# Patient Record
Sex: Female | Born: 1970
Health system: Southern US, Community
[De-identification: ages and names within clinical notes are randomized; demographics above are authoritative.]

## PROBLEM LIST (undated history)

## (undated) DIAGNOSIS — G43909 Migraine, unspecified, not intractable, without status migrainosus: Secondary | ICD-10-CM

## (undated) DIAGNOSIS — T7840XA Allergy, unspecified, initial encounter: Secondary | ICD-10-CM

## (undated) DIAGNOSIS — N390 Urinary tract infection, site not specified: Secondary | ICD-10-CM

## (undated) DIAGNOSIS — B019 Varicella without complication: Secondary | ICD-10-CM

## (undated) DIAGNOSIS — F32A Depression, unspecified: Secondary | ICD-10-CM

## (undated) HISTORY — DX: Depression, unspecified: F32.A

## (undated) HISTORY — DX: Varicella without complication: B01.9

## (undated) HISTORY — DX: Allergy, unspecified, initial encounter: T78.40XA

## (undated) HISTORY — DX: Urinary tract infection, site not specified: N39.0

## (undated) HISTORY — DX: Migraine, unspecified, not intractable, without status migrainosus: G43.909

## (undated) HISTORY — PX: ABDOMINOPLASTY: SUR9

---

## 2011-12-18 ENCOUNTER — Encounter (HOSPITAL_BASED_OUTPATIENT_CLINIC_OR_DEPARTMENT_OTHER): Payer: Self-pay | Admitting: *Deleted

## 2011-12-18 ENCOUNTER — Emergency Department (HOSPITAL_BASED_OUTPATIENT_CLINIC_OR_DEPARTMENT_OTHER)
Admission: EM | Admit: 2011-12-18 | Discharge: 2011-12-19 | Disposition: A | Discharge status: Home / Self Care | Payer: No Typology Code available for payment source | Attending: Emergency Medicine | Admitting: Emergency Medicine

## 2011-12-18 ENCOUNTER — Emergency Department (HOSPITAL_BASED_OUTPATIENT_CLINIC_OR_DEPARTMENT_OTHER): Payer: No Typology Code available for payment source

## 2011-12-18 DIAGNOSIS — Z043 Encounter for examination and observation following other accident: Secondary | ICD-10-CM | POA: Insufficient documentation

## 2011-12-18 DIAGNOSIS — S39012A Strain of muscle, fascia and tendon of lower back, initial encounter: Secondary | ICD-10-CM

## 2011-12-18 MED ORDER — IBUPROFEN 800 MG PO TABS
800.0000 mg | ORAL_TABLET | Freq: Once | ORAL | Status: AC
  Administered 2011-12-18: 800 mg via ORAL
  Filled 2011-12-18: qty 1

## 2011-12-18 MED ORDER — TRAMADOL HCL 50 MG PO TABS: 50.0000 mg | ORAL_TABLET | Freq: Four times a day (QID) | ORAL | Status: DC | PRN
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
## 2015-06-02 ENCOUNTER — Encounter (HOSPITAL_BASED_OUTPATIENT_CLINIC_OR_DEPARTMENT_OTHER): Payer: Self-pay

## 2015-06-02 ENCOUNTER — Emergency Department (HOSPITAL_BASED_OUTPATIENT_CLINIC_OR_DEPARTMENT_OTHER)
Admission: EM | Admit: 2015-06-02 | Discharge: 2015-06-03 | Disposition: A | Discharge status: Home / Self Care | Payer: No Typology Code available for payment source | Attending: Emergency Medicine | Admitting: Emergency Medicine

## 2015-06-02 DIAGNOSIS — Z8619 Personal history of other infectious and parasitic diseases: Secondary | ICD-10-CM | POA: Insufficient documentation

## 2015-06-02 DIAGNOSIS — M545 Low back pain: Secondary | ICD-10-CM | POA: Insufficient documentation

## 2015-06-02 DIAGNOSIS — G8929 Other chronic pain: Secondary | ICD-10-CM | POA: Insufficient documentation

## 2015-06-02 DIAGNOSIS — N72 Inflammatory disease of cervix uteri: Secondary | ICD-10-CM

## 2015-06-02 DIAGNOSIS — Z3202 Encounter for pregnancy test, result negative: Secondary | ICD-10-CM | POA: Insufficient documentation

## 2015-06-02 LAB — URINALYSIS, ROUTINE W REFLEX MICROSCOPIC
BILIRUBIN URINE: NEGATIVE
Glucose, UA: NEGATIVE mg/dL
HGB URINE DIPSTICK: NEGATIVE
KETONES UR: NEGATIVE mg/dL
Leukocytes, UA: NEGATIVE
NITRITE: NEGATIVE
PH: 5 (ref 5.0–8.0)
Protein, ur: NEGATIVE mg/dL
SPECIFIC GRAVITY, URINE: 1.025 (ref 1.005–1.030)

## 2015-06-02 LAB — WET PREP, GENITAL
SPERM: NONE SEEN
Trich, Wet Prep: NONE SEEN
YEAST WET PREP: NONE SEEN

## 2015-06-02 LAB — PREGNANCY, URINE: Preg Test, Ur: NEGATIVE

## 2015-06-02 MED ORDER — CEFTRIAXONE SODIUM 250 MG IJ SOLR
250.0000 mg | Freq: Once | INTRAMUSCULAR | Status: AC
  Administered 2015-06-03: 250 mg via INTRAMUSCULAR
  Filled 2015-06-02: qty 250

## 2015-06-02 MED ORDER — IBUPROFEN 800 MG PO TABS: 800.0000 mg | ORAL_TABLET | Freq: Three times a day (TID) | ORAL | Status: DC | PRN

## 2015-06-02 MED ORDER — AZITHROMYCIN 250 MG PO TABS
1000.0000 mg | ORAL_TABLET | Freq: Once | ORAL | Status: AC
Start: 2015-06-02 — End: 2015-06-03
  Administered 2015-06-03: 1000 mg via ORAL
  Filled 2015-06-02: qty 4

## 2015-06-02 MED ORDER — LIDOCAINE HCL (PF) 1 % IJ SOLN
INTRAMUSCULAR | Status: AC
  Administered 2015-06-03: 5 mL
  Filled 2015-06-02: qty 5

## 2015-06-02 NOTE — ED Notes (Signed)
Pt states has had back pain x 1 year worse last 2 days,  Pelvic pain x 3-4 days  Denies urinary sx,  Denies vag dc

## 2015-06-02 NOTE — ED Notes (Signed)
Lower back pain and pelvic pain x 3 days-denies n/v/d or vaginal d/c-NAD-steady gait
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
## 2015-06-02 NOTE — Discharge Instructions (Signed)
Cervicitis Cervicitis is a soreness and swelling (inflammation) of the cervix. Your cervix is located at the bottom of your uterus. It opens up to the vagina. CAUSES   Sexually transmitted infections (STIs).   Allergic reaction.   Medicines or birth control devices that are put in the vagina.   Injury to the cervix.   Bacterial infections.  RISK FACTORS You are at greater risk if you:  Have unprotected sexual intercourse.  Have sexual intercourse with many partners.  Began sexual intercourse at an early age.  Have a history of STIs. SYMPTOMS  There may be no symptoms. If symptoms occur, they may include:   Gray, white, yellow, or bad-smelling vaginal discharge.   Pain or itching of the area outside the vagina.   Painful sexual intercourse.   Lower abdominal or lower back pain, especially during intercourse.   Frequent urination.   Abnormal vaginal bleeding between periods, after sexual intercourse, or after menopause.   Pressure or a heavy feeling in the pelvis.  DIAGNOSIS  Diagnosis is made after a pelvic exam. Other tests may include:   Examination of any discharge under a microscope (wet prep).   A Pap test.  TREATMENT  Treatment will depend on the cause of cervicitis. If it is caused by an STI, both you and your partner will need to be treated. Antibiotic medicines will be given.  HOME CARE INSTRUCTIONS   Do not have sexual intercourse until your health care provider says it is okay.   Do not have sexual intercourse until your partner has been treated, if your cervicitis is caused by an STI.   Take your antibiotics as directed. Finish them even if you start to feel better.  SEEK MEDICAL CARE IF:  Your symptoms come back.   You have a fever.  MAKE SURE YOU:   Understand these instructions.  Will watch your condition.  Will get help right away if you are not doing well or get worse.   This information is not intended to replace  advice given to you by your health care provider. Make sure you discuss any questions you have with your health care provider.   Document Released: 03/13/2005 Document Revised: 03/18/2013 Document Reviewed: 09/04/2012 Elsevier Interactive Patient Education [employer].

## 2015-06-03 LAB — RAPID HIV SCREEN (HIV 1/2 AB+AG)
HIV 1/2 ANTIBODIES: NONREACTIVE
HIV-1 P24 ANTIGEN - HIV24: NONREACTIVE

## 2015-06-04 LAB — GC/CHLAMYDIA PROBE AMP (~~LOC~~) NOT AT ARMC
Chlamydia: NEGATIVE
NEISSERIA GONORRHEA: NEGATIVE

## 2015-06-04 LAB — RPR: RPR: NONREACTIVE

## 2015-09-02 ENCOUNTER — Encounter (HOSPITAL_COMMUNITY): Payer: Self-pay | Admitting: Emergency Medicine

## 2015-09-02 ENCOUNTER — Emergency Department (HOSPITAL_COMMUNITY)
Admission: EM | Admit: 2015-09-02 | Discharge: 2015-09-02 | Disposition: A | Discharge status: Home / Self Care | Payer: No Typology Code available for payment source | Attending: Emergency Medicine | Admitting: Emergency Medicine

## 2015-09-02 ENCOUNTER — Emergency Department (HOSPITAL_COMMUNITY): Payer: No Typology Code available for payment source

## 2015-09-02 DIAGNOSIS — M545 Low back pain, unspecified: Secondary | ICD-10-CM

## 2015-09-02 DIAGNOSIS — Y939 Activity, unspecified: Secondary | ICD-10-CM | POA: Diagnosis not present

## 2015-09-02 DIAGNOSIS — Y999 Unspecified external cause status: Secondary | ICD-10-CM | POA: Diagnosis not present

## 2015-09-02 DIAGNOSIS — Y92410 Unspecified street and highway as the place of occurrence of the external cause: Secondary | ICD-10-CM | POA: Insufficient documentation

## 2015-09-02 MED ORDER — OXYCODONE HCL 5 MG PO TABS
5.0000 mg | ORAL_TABLET | Freq: Once | ORAL | Status: AC
  Administered 2015-09-02: 5 mg via ORAL
  Filled 2015-09-02: qty 1

## 2015-09-02 MED ORDER — NAPROXEN 500 MG PO TABS: 500.0000 mg | ORAL_TABLET | Freq: Two times a day (BID) | ORAL | Status: DC

## 2015-09-02 MED ORDER — METHOCARBAMOL 500 MG PO TABS: 500.0000 mg | ORAL_TABLET | Freq: Two times a day (BID) | ORAL | Status: DC
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
## 2015-09-02 NOTE — Discharge Instructions (Signed)
Take your medications as prescribed. I also recommend applying ice to affected areas for 15-20 minutes 3-4 times daily as needed for pain relief. Follow-up with your primary care provider within the next week if your symptoms have not improved. Please return to the Emergency Department if symptoms worsen or new onset of fever, numbness, tingling, groin numbness, abdominal pain, urinary retention, loss of bowel or bladder, weakness.
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
## 2016-01-09 ENCOUNTER — Encounter (HOSPITAL_BASED_OUTPATIENT_CLINIC_OR_DEPARTMENT_OTHER): Payer: Self-pay | Admitting: *Deleted

## 2016-01-09 DIAGNOSIS — M542 Cervicalgia: Secondary | ICD-10-CM | POA: Insufficient documentation

## 2016-01-09 DIAGNOSIS — Y9389 Activity, other specified: Secondary | ICD-10-CM | POA: Diagnosis not present

## 2016-01-09 DIAGNOSIS — Y999 Unspecified external cause status: Secondary | ICD-10-CM | POA: Insufficient documentation

## 2016-01-09 DIAGNOSIS — Y92410 Unspecified street and highway as the place of occurrence of the external cause: Secondary | ICD-10-CM | POA: Diagnosis not present

## 2016-01-09 DIAGNOSIS — M25512 Pain in left shoulder: Secondary | ICD-10-CM | POA: Insufficient documentation
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
## 2016-01-10 ENCOUNTER — Emergency Department (HOSPITAL_BASED_OUTPATIENT_CLINIC_OR_DEPARTMENT_OTHER)
Admission: EM | Admit: 2016-01-10 | Discharge: 2016-01-10 | Disposition: A | Discharge status: Home / Self Care | Payer: Medicaid Other | Attending: Emergency Medicine | Admitting: Emergency Medicine

## 2016-01-10 DIAGNOSIS — M542 Cervicalgia: Secondary | ICD-10-CM

## 2016-01-10 LAB — PREGNANCY, URINE: PREG TEST UR: NEGATIVE

## 2016-01-10 MED ORDER — ACETAMINOPHEN 325 MG PO TABS
650.0000 mg | ORAL_TABLET | Freq: Once | ORAL | Status: AC
  Administered 2016-01-10: 650 mg via ORAL
  Filled 2016-01-10: qty 2

## 2016-01-10 MED ORDER — IBUPROFEN 800 MG PO TABS: 800.0000 mg | ORAL_TABLET | Freq: Three times a day (TID) | ORAL | 0 refills | Status: DC

## 2016-01-10 MED ORDER — IBUPROFEN 800 MG PO TABS
800.0000 mg | ORAL_TABLET | Freq: Once | ORAL | Status: AC
  Administered 2016-01-10: 800 mg via ORAL
  Filled 2016-01-10: qty 1

## 2016-01-10 MED ORDER — CYCLOBENZAPRINE HCL 10 MG PO TABS: 10.0000 mg | ORAL_TABLET | Freq: Two times a day (BID) | ORAL | 0 refills | Status: DC | PRN
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
## 2016-01-10 NOTE — ED Notes (Signed)
Patient is in good condition, discharge instrucitons reviewed, prescription medication reviewed, follow up care reviewed; patient verbalized understanding; patient is ambulatory, going home with son

## 2019-11-19 ENCOUNTER — Other Ambulatory Visit: Payer: Self-pay

## 2019-11-19 ENCOUNTER — Ambulatory Visit
Admission: EM | Admit: 2019-11-19 | Discharge: 2019-11-19 | Disposition: A | Discharge status: Home / Self Care | Payer: 59 | Attending: Physician Assistant | Admitting: Physician Assistant

## 2019-11-19 ENCOUNTER — Ambulatory Visit (INDEPENDENT_AMBULATORY_CARE_PROVIDER_SITE_OTHER): Payer: 59

## 2019-11-19 DIAGNOSIS — R05 Cough: Secondary | ICD-10-CM | POA: Diagnosis not present

## 2019-11-19 DIAGNOSIS — R0902 Hypoxemia: Secondary | ICD-10-CM | POA: Diagnosis not present

## 2019-11-19 DIAGNOSIS — U071 COVID-19: Secondary | ICD-10-CM | POA: Diagnosis not present

## 2019-11-19 DIAGNOSIS — J1282 Pneumonia due to coronavirus disease 2019: Secondary | ICD-10-CM | POA: Diagnosis not present

## 2019-11-19 DIAGNOSIS — R509 Fever, unspecified: Secondary | ICD-10-CM | POA: Diagnosis not present

## 2019-11-19 DIAGNOSIS — J189 Pneumonia, unspecified organism: Secondary | ICD-10-CM | POA: Diagnosis not present

## 2019-11-19 MED ORDER — ACETAMINOPHEN 325 MG PO TABS
650.0000 mg | ORAL_TABLET | Freq: Once | ORAL | Status: AC
  Administered 2019-11-19: 650 mg via ORAL

## 2019-11-19 MED ORDER — IPRATROPIUM-ALBUTEROL 0.5-2.5 (3) MG/3ML IN SOLN
3.0000 mL | Freq: Once | RESPIRATORY_TRACT | Status: AC
  Administered 2019-11-19: 3 mL via RESPIRATORY_TRACT

## 2019-11-19 MED ORDER — ALBUTEROL SULFATE (2.5 MG/3ML) 0.083% IN NEBU: 2.5000 mg | INHALATION_SOLUTION | Freq: Four times a day (QID) | RESPIRATORY_TRACT | 0 refills | Status: DC | PRN

## 2019-11-19 MED ORDER — DOXYCYCLINE HYCLATE 100 MG PO CAPS: 100.0000 mg | ORAL_CAPSULE | Freq: Two times a day (BID) | ORAL | 0 refills | Status: DC

## 2019-11-19 MED ORDER — BENZONATATE 200 MG PO CAPS: 200.0000 mg | ORAL_CAPSULE | Freq: Three times a day (TID) | ORAL | 0 refills | Status: DC

## 2019-11-19 NOTE — ED Provider Notes (Addendum)
[facility]    CSN: [id number] Arrival date & time: 11/19/19  1828      History   Chief Complaint Chief Complaint  Patient presents with  . Fever  . Cough    HPI Faith Roth is a 49 y.o. female.   49 year old female comes in with 1 week history of URI symptoms with positive COVID test. Has had cough, fever, shob, nasal congestion, rhinorrhea. Symptoms worse the past few days. tmax 102, responsive to antipyretic. Denies nausea/vomiting, diarrhea.      History reviewed. No pertinent past medical history.  There are no problems to display for this patient.   Past Surgical History:  Procedure Laterality Date  . CESAREAN SECTION      OB History   No obstetric history on file.      Home Medications    Prior to Admission medications   Medication Sig Start Date End Date Taking? Authorizing Provider  albuterol (PROVENTIL) (2.5 MG/3ML) 0.083% nebulizer solution Take 3 mLs (2.5 mg total) by nebulization every 6 (six) hours as needed for wheezing or shortness of breath. 11/19/19   Tasia Catchings, Italo Banton V, PA-C  benzonatate (TESSALON) 200 MG capsule Take 1 capsule (200 mg total) by mouth every 8 (eight) hours. 11/19/19   Tasia Catchings, Jordayn Mink V, PA-C  cyclobenzaprine (FLEXERIL) 10 MG tablet Take 1 tablet (10 mg total) by mouth 2 (two) times daily as needed for muscle spasms. 01/10/16   Gloriann Loan, PA-C  doxycycline (VIBRAMYCIN) 100 MG capsule Take 1 capsule (100 mg total) by mouth 2 (two) times daily. 11/19/19   Tasia Catchings, Janett Kamath V, PA-C  ibuprofen (ADVIL,MOTRIN) 800 MG tablet Take 1 tablet (800 mg total) by mouth 3 (three) times daily. 01/10/16   Gloriann Loan, PA-C  methocarbamol (ROBAXIN) 500 MG tablet Take 1 tablet (500 mg total) by mouth 2 (two) times daily. 09/02/15   Nona Dell, PA-C  naproxen (NAPROSYN) 500 MG tablet Take 1 tablet (500 mg total) by mouth 2 (two) times daily. 09/02/15   Nona Dell, PA-C  PRESCRIPTION MEDICATION Take 1 tablet by mouth 2 (two) times daily  as needed (headaches/ migraines with mesntraul cycle).    [provider]    Family History Family History  Problem Relation Age of Onset  . Hypertension Mother   . Diabetes Mother   . Hypertension Father   . Diabetes Father     Social History Social History   Tobacco Use  . Smoking status: Never Smoker  Substance Use Topics  . Alcohol use: No  . Drug use: No     Allergies   Propofol   Review of Systems Review of Systems   Physical Exam Triage Vital Signs ED Triage Vitals [11/19/19 2039]  Enc Vitals Group     BP 111/67     Pulse Rate (!) 135     Resp (!) 30     Temp (!) 100.6 F (38.1 C)     Temp Source Oral     SpO2 (!) 88 %     Weight      Height      Head Circumference      Peak Flow      Pain Score      Pain Loc      Pain Edu?      Excl. in [place]?    No data found.  Updated Vital Signs BP 111/67 (BP Location: Left Arm)   Pulse (!) 135   Temp (!) 100.6 F (  38.1 C) (Oral)   Resp (!) 24   LMP 11/05/2019 (Approximate)   SpO2 90%   Physical Exam Constitutional:      General: She is not in acute distress.    Appearance: Normal appearance. She is not ill-appearing, toxic-appearing or diaphoretic.  HENT:     Head: Normocephalic and atraumatic.     Mouth/Throat:     Mouth: Mucous membranes are moist.     Pharynx: Oropharynx is clear. Uvula midline.  Cardiovascular:     Rate and Rhythm: Normal rate and regular rhythm.     Heart sounds: Normal heart sounds. No murmur heard.  No friction rub. No gallop.   Pulmonary:     Effort: Pulmonary effort is normal. No accessory muscle usage, prolonged expiration, respiratory distress or retractions.     Comments: Coughing throughout exam. Lungs with diffuse rales to bilateral lower lobes Musculoskeletal:     Cervical back: Normal range of motion and neck supple.  Neurological:     General: No focal deficit present.     Mental Status: She is alert and oriented to person, place, and time.       UC Treatments / Results  Labs (all labs ordered are listed, but only abnormal results are displayed) Labs Reviewed - No data to display  EKG   Radiology DG Chest 2 View  Result Date: 11/19/2019 CLINICAL DATA:  Shortness of breath and hypoxia. COVID positive. Cough and fever. EXAM: CHEST - 2 VIEW COMPARISON:  01/16/2018 FINDINGS: Lung volumes are low. Patchy heterogeneous bilateral airspace opacities in a mid-lower lung zone predominant distribution. Heart is normal in size. No pneumothorax or large pleural effusion. No acute osseous abnormalities are seen. IMPRESSION: Patchy heterogeneous bilateral airspace opacities in a mid-lower lung zone predominant distribution, pattern consistent with multifocal pneumonia, pattern consistent with COVID-19. Electronically Signed   By: Keith Rake M.D.   On: 11/19/2019 21:06    Procedures Procedures (including critical care time)  Medications Ordered in UC Medications  acetaminophen (TYLENOL) tablet 650 mg (650 mg Oral Given 11/19/19 2051)    Initial Impression / Assessment and Plan / UC Course  I have reviewed the triage vital signs and the nursing notes.  Pertinent labs & imaging results that were available during my care of the patient were reviewed by me and considered in my medical decision making (see chart for details).    Patient with pulse ox trending 88 to 92%, no respiratory distress. ?  Some reactive airway given patient with cough when attempting to talk.  However, when she is not coughing, tachypnea resolves.  Lungs with bilateral rales.  Chest x-ray consistent with Covid pneumonia.  However, given 7-day history of symptoms, continued fever, will cover for superimposed bacterial infection with doxycycline.  Will attempt albuterol for cough and shortness of breath.  However, low threshold for ED visit.  Strict return precautions given.  Patient expresses understanding and agrees plan.  Given patient's pharmacy closed at  this time. duoneb solution provided to bring home for shortness of breath.  Final Clinical Impressions(s) / UC Diagnoses   Final diagnoses:  Pneumonia due to COVID-19 virus    ED Prescriptions    Medication Sig Dispense Auth. Provider   doxycycline (VIBRAMYCIN) 100 MG capsule Take 1 capsule (100 mg total) by mouth 2 (two) times daily. 14 capsule Zelma Snead V, PA-C   albuterol (PROVENTIL) (2.5 MG/3ML) 0.083% nebulizer solution Take 3 mLs (2.5 mg total) by nebulization every 6 (six) hours as needed for wheezing  or shortness of breath. 75 mL Flara Storti V, PA-C   benzonatate (TESSALON) 200 MG capsule Take 1 capsule (200 mg total) by mouth every 8 (eight) hours. 21 capsule Ok Edwards, PA-C     PDMP not reviewed this encounter.   Ok Edwards, PA-C 11/19/19 2131    Ok Edwards, PA-C 11/19/19 2135

## 2019-11-19 NOTE — Discharge Instructions (Signed)
Your chest x-ray consistent with Covid pneumonia.  As discussed, this could be a viral pneumonia.  However, given 7-day of symptoms with fever, will cover for possible bacterial infection with doxycycline.  Albuterol as needed for shortness of breath/cough.  Tessalon for cough.  As discussed, your oxygen level is slightly low, but not alarming at this time.  However, if you become more short of breath, go to the emergency department for further evaluation.

## 2019-11-19 NOTE — ED Triage Notes (Signed)
Pt c/o cough, fever, SOB, congestion, runny nose for approx 1 week. Was tested for COVID (positive) at [facility] testing site.  Reports symptoms worsening over the past couple of days and had Tmax 102 earlier today. Denies n/v/d. Last took tylenol this morning. Harsh cough noted, difficulty speaking full sentences 2/2 SOB, cough.

## 2019-12-02 ENCOUNTER — Ambulatory Visit
Admission: EM | Admit: 2019-12-02 | Discharge: 2019-12-02 | Disposition: A | Discharge status: Home / Self Care | Payer: 59 | Attending: Family Medicine | Admitting: Family Medicine

## 2019-12-02 ENCOUNTER — Encounter: Payer: Self-pay | Admitting: Emergency Medicine

## 2019-12-02 ENCOUNTER — Other Ambulatory Visit: Payer: Self-pay

## 2019-12-02 DIAGNOSIS — J189 Pneumonia, unspecified organism: Secondary | ICD-10-CM

## 2019-12-02 MED ORDER — PREDNISONE 10 MG (21) PO TBPK: ORAL_TABLET | Freq: Every day | ORAL | 0 refills | Status: DC

## 2019-12-02 MED ORDER — ALBUTEROL SULFATE (2.5 MG/3ML) 0.083% IN NEBU: 2.5000 mg | INHALATION_SOLUTION | Freq: Four times a day (QID) | RESPIRATORY_TRACT | 0 refills | Status: DC | PRN

## 2019-12-02 NOTE — ED Provider Notes (Signed)
[facility]    CSN: [id number] Arrival date & time: 12/02/19  1110      History   Chief Complaint Chief Complaint  Patient presents with  . Follow-up    HPI Camora Tremain is a 49 y.o. female.   Patient was seen last week and then treatment for pneumonia was initiated.  She is much better now.  Dyspnea has improved but still has some on exertion.  Still has some cough but that has also improved and cough is mostly nonproductive.  HPI  History reviewed. No pertinent past medical history.  There are no problems to display for this patient.   Past Surgical History:  Procedure Laterality Date  . CESAREAN SECTION      OB History   No obstetric history on file.      Home Medications    Prior to Admission medications   Medication Sig Start Date End Date Taking? Authorizing Provider  albuterol (PROVENTIL) (2.5 MG/3ML) 0.083% nebulizer solution Take 3 mLs (2.5 mg total) by nebulization every 6 (six) hours as needed for wheezing or shortness of breath. 11/19/19   Tasia Catchings, Amy V, PA-C  benzonatate (TESSALON) 200 MG capsule Take 1 capsule (200 mg total) by mouth every 8 (eight) hours. 11/19/19   Tasia Catchings, Amy V, PA-C  cyclobenzaprine (FLEXERIL) 10 MG tablet Take 1 tablet (10 mg total) by mouth 2 (two) times daily as needed for muscle spasms. Patient not taking: Reported on 12/02/2019 01/10/16   Gloriann Loan, PA-C  doxycycline (VIBRAMYCIN) 100 MG capsule Take 1 capsule (100 mg total) by mouth 2 (two) times daily. 11/19/19   Tasia Catchings, Amy V, PA-C  ibuprofen (ADVIL,MOTRIN) 800 MG tablet Take 1 tablet (800 mg total) by mouth 3 (three) times daily. 01/10/16   Gloriann Loan, PA-C  methocarbamol (ROBAXIN) 500 MG tablet Take 1 tablet (500 mg total) by mouth 2 (two) times daily. 09/02/15   Nona Dell, PA-C  naproxen (NAPROSYN) 500 MG tablet Take 1 tablet (500 mg total) by mouth 2 (two) times daily. 09/02/15   Nona Dell, PA-C  PRESCRIPTION MEDICATION Take 1 tablet by mouth 2  (two) times daily as needed (headaches/ migraines with mesntraul cycle).    [provider]    Family History Family History  Problem Relation Age of Onset  . Hypertension Mother   . Diabetes Mother   . Hypertension Father   . Diabetes Father     Social History Social History   Tobacco Use  . Smoking status: Never Smoker  Substance Use Topics  . Alcohol use: No  . Drug use: No     Allergies   Propofol   Review of Systems Review of Systems  Constitutional: Positive for fatigue.  Respiratory: Positive for cough.   All other systems reviewed and are negative.    Physical Exam Triage Vital Signs ED Triage Vitals  Enc Vitals Group     BP 12/02/19 1155 130/67     Pulse Rate 12/02/19 1155 85     Resp 12/02/19 1155 18     Temp 12/02/19 1155 99.3 F (37.4 C)     Temp Source 12/02/19 1155 Oral     SpO2 12/02/19 1155 96 %     Weight --      Height --      Head Circumference --      Peak Flow --      Pain Score 12/02/19 1156 4     Pain Loc --  Pain Edu? --      Excl. in [place]? --    No data found.  Updated Vital Signs BP 130/67 (BP Location: Right Arm)   Pulse 85   Temp 99.3 F (37.4 C) (Oral)   Resp 18   LMP 11/05/2019 (Approximate)   SpO2 96%   Visual Acuity Right Eye Distance:   Left Eye Distance:   Bilateral Distance:    Right Eye Near:   Left Eye Near:    Bilateral Near:     Physical Exam Vitals and nursing note reviewed.  Constitutional:      Appearance: Normal appearance. She is normal weight.  HENT:     Head: Normocephalic.     Nose: Nose normal.  Cardiovascular:     Rate and Rhythm: Normal rate and regular rhythm.  Pulmonary:     Effort: Pulmonary effort is normal.     Breath sounds: Normal breath sounds.  Neurological:     General: No focal deficit present.     Mental Status: She is alert and oriented to person, place, and time.      UC Treatments / Results  Labs (all labs ordered are listed, but only abnormal  results are displayed) Labs Reviewed - No data to display  EKG   Radiology No results found.  Procedures Procedures (including critical care time)  Medications Ordered in UC Medications - No data to display  Initial Impression / Assessment and Plan / UC Course  I have reviewed the triage vital signs and the nursing notes.  Pertinent labs & imaging results that were available during my care of the patient were reviewed by me and considered in my medical decision making (see chart for details).     Pneumonia follow-up.  Patient has clearly improved.  Will add albuterol inhaler along with prednisone for persistent cough and dyspnea with exertion Final Clinical Impressions(s) / UC Diagnoses   Final diagnoses:  None   Discharge Instructions   None    ED Prescriptions    None     PDMP not reviewed this encounter.   Wardell Honour, MD 12/02/19 1220

## 2019-12-02 NOTE — ED Triage Notes (Signed)
Pt here for follow up and recheck after being diagnosed with covid PNA; pt sts some coiugh and SOB with exertion but over all some improved

## 2019-12-17 ENCOUNTER — Ambulatory Visit: Admission: EM | Admit: 2019-12-17 | Discharge: 2019-12-17 | Discharge status: Absent without leave | Payer: 59

## 2019-12-18 ENCOUNTER — Other Ambulatory Visit: Payer: Self-pay | Admitting: Family Medicine

## 2019-12-18 DIAGNOSIS — Z1231 Encounter for screening mammogram for malignant neoplasm of breast: Secondary | ICD-10-CM

## 2020-02-24 ENCOUNTER — Other Ambulatory Visit: Payer: Self-pay

## 2020-02-24 ENCOUNTER — Encounter: Payer: Self-pay | Admitting: Primary Care

## 2020-02-24 ENCOUNTER — Ambulatory Visit (INDEPENDENT_AMBULATORY_CARE_PROVIDER_SITE_OTHER): Payer: 59 | Admitting: Primary Care

## 2020-02-24 VITALS — BP 118/64 | HR 109 | Temp 98.8°F | Ht 64.5 in | Wt 170.2 lb

## 2020-02-24 DIAGNOSIS — M542 Cervicalgia: Secondary | ICD-10-CM | POA: Diagnosis not present

## 2020-02-24 DIAGNOSIS — M5412 Radiculopathy, cervical region: Secondary | ICD-10-CM | POA: Insufficient documentation

## 2020-02-24 DIAGNOSIS — G43709 Chronic migraine without aura, not intractable, without status migrainosus: Secondary | ICD-10-CM

## 2020-02-24 MED ORDER — RIZATRIPTAN BENZOATE 5 MG PO TABS: ORAL_TABLET | ORAL | 0 refills | Status: DC

## 2020-02-24 MED ORDER — METHYLPREDNISOLONE ACETATE 80 MG/ML IJ SUSP
80.0000 mg | Freq: Once | INTRAMUSCULAR | Status: AC
  Administered 2020-02-24: 80 mg via INTRAMUSCULAR

## 2020-02-24 MED ORDER — CYCLOBENZAPRINE HCL 5 MG PO TABS: 5.0000 mg | ORAL_TABLET | Freq: Every evening | ORAL | 0 refills | Status: DC | PRN

## 2020-02-24 NOTE — Assessment & Plan Note (Signed)
Acute for the last month, obvious muscle spasm with decrease in ROM on exam today.   Discussed the absolute need to increase ROM to neck and left upper extremity to reduce overall stiffness.   IM Depo Medrol 80 mg provided today due to radiculopathy. She's already taken the max dose of diclofenac, advised against taking any more.  Rx for Flexeril 5 mg sent to pharmacy, drowsiness precautions provided. Also discussed topical agents such as [employer], Aspercream, etc. Heat/Ice.   She will update tomorrow.

## 2020-02-24 NOTE — Assessment & Plan Note (Signed)
Chronic and are hormonal with menses. Sumatriptan seems to cause intolerable side effects to where she can only take a small amount which is ineffective for complete migraine abortion.   Will switch from sumatriptan to rizatriptan to see if this helps with migraines and has less side effects. She will update.

## 2020-02-24 NOTE — Patient Instructions (Signed)
Stop sumatriptan (Imitrex) for migraines.  Try rizatriptan (Maxalt) for migraines. Take 1/2 or 1 tablet at migraine onset. May repeat with second dose 2 hours later if migraine persists.   Resume diclofenac 75 mg twice daily for muscle and neck pain. Do not take Ibuprofen with this. You could take Tylenol if needed.   Start cyclobenzaprine 5 mg at night for muscle spasm. You can take 1-2 tablets by mouth at bedtime. This will cause drowsiness.   Try some Icy Hot or other topical muscle relaxer creams.   Try to move your neck and arm to increase range of motion and to reduce stiffness.  It was a pleasure to meet you today! Please don't hesitate to call or message me with any questions. Welcome to [facility]!

## 2020-02-24 NOTE — Addendum Note (Signed)
Addended by: Francella Solian on: 02/24/2020 09:49 AM   Modules accepted: Orders

## 2020-02-24 NOTE — Progress Notes (Signed)
Subjective:    Patient ID: Faith Roth, female    DOB: 06/10/1970, 49 y.o.   MRN: [id number]  HPI  This visit occurred during the SARS-CoV-2 public health emergency.  Safety protocols were in place, including screening questions prior to the visit, additional usage of staff PPE, and extensive cleaning of exam room while observing appropriate contact time as indicated for disinfecting solutions.   Faith Roth is a 49 year old female who presents today to establish care and discuss the problems mentioned below. Will obtain/review records.  1) Migraines: Chronic for years and located to temporal and frontal lobes. Migraines occur once monthly, cyclical just before and during menses. She will typically experience nausea and photophobia during migraines. She is managed on sumatriptan 25 mg, takes 1/2 tablet with improvement in migraine but without resolve. She cannot take a higher dose than 12.5 mg of sumatriptan due to side effects of dizziness and drowsiness.   2) Acute Neck Pain: Located to the left lateral and posterior neck which began in early November 2021 after waking. She recalls feeling as though slept in an undesirable position. Since then she's noticed increased spasm, pain, swelling to the left lateral neck with radiation of pain down her left upper extremity to the elbow, numbness down to the left elbow, can barely turn her neck to the left side, can barely lift her left upper extremity. Her pain is also evident to the left posterior shoulder and scapular region.  She cannot apply pressure to the left posterior back/shoulder/neck, is very uncomfortable with laying in bed, has to sit up in bed with numerous pillows in order to get some relief.   She took one diclofenac tablet last night (from an older prescription) with some improvement last night. She woke up this morning and her pain had returned full force. She took two 75 mg tablets of diclofenac this monring without improvement.  She's also tried OTC Ibuprofen 600 mg a few days ago without relief. She's not applied anything topical.   BP Readings from Last 3 Encounters:  02/24/20 118/64  12/02/19 130/67  11/19/19 111/67     Review of Systems  Musculoskeletal: Positive for myalgias and neck pain.  Skin: Negative for color change.  Neurological: Positive for headaches.       See HPI       Past Medical History:  Diagnosis Date  . Allergy      Social History   Socioeconomic History  . Marital status: Single    Spouse name: Not on file  . Number of children: Not on file  . Years of education: Not on file  . Highest education level: Not on file  Occupational History  . Not on file  Tobacco Use  . Smoking status: Never Smoker  . Smokeless tobacco: Never Used  Substance and Sexual Activity  . Alcohol use: No  . Drug use: No  . Sexual activity: Yes    Birth control/protection: Pill  Other Topics Concern  . Not on file  Social History Narrative  . Not on file   Social Determinants of Health   Financial Resource Strain:   . Difficulty of Paying Living Expenses: Not on file  Food Insecurity:   . Worried About Charity fundraiser in the Last Year: Not on file  . Ran Out of Food in the Last Year: Not on file  Transportation Needs:   . Lack of Transportation (Medical): Not on file  . Lack of Transportation (Non-Medical): Not  on file  Physical Activity:   . Days of Exercise per Week: Not on file  . Minutes of Exercise per Session: Not on file  Stress:   . Feeling of Stress : Not on file  Social Connections:   . Frequency of Communication with Friends and Family: Not on file  . Frequency of Social Gatherings with Friends and Family: Not on file  . Attends Religious Services: Not on file  . Active Member of Clubs or Organizations: Not on file  . Attends Archivist Meetings: Not on file  . Marital Status: Not on file  Intimate Partner Violence:   . Fear of Current or Ex-Partner: Not  on file  . Emotionally Abused: Not on file  . Physically Abused: Not on file  . Sexually Abused: Not on file    Past Surgical History:  Procedure Laterality Date  . CESAREAN SECTION      Family History  Problem Relation Age of Onset  . Hypertension Mother   . Diabetes Mother   . Hypertension Father   . Diabetes Father     Allergies  Allergen Reactions  . Propofol Other (See Comments)    Wouldn't wake up from anesthesia Pt states, "couldn't be awaken from surgery."     Current Outpatient Medications on File Prior to Visit  Medication Sig Dispense Refill  . diclofenac (VOLTAREN) 75 MG EC tablet Take 75 mg by mouth 2 (two) times daily.    Marland Kitchen ibuprofen (ADVIL,MOTRIN) 800 MG tablet Take 1 tablet (800 mg total) by mouth 3 (three) times daily. 21 tablet 0   No current facility-administered medications on file prior to visit.    BP 118/64   Pulse (!) 109   Temp 98.8 F (37.1 C) (Oral)   Ht 5' 4.5" (1.638 m)   Wt 170 lb 3.2 oz (77.2 kg)   LMP 02/02/2020   SpO2 97%   BMI 28.76 kg/m    Objective:   Physical Exam Neck:      Comments: Obvious muscle spasm to left lateral neck. Limited ROM to entire neck due to pain. Cardiovascular:     Rate and Rhythm: Normal rate and regular rhythm.  Pulmonary:     Effort: Pulmonary effort is normal.     Breath sounds: Normal breath sounds.  Musculoskeletal:     Left shoulder: No bony tenderness. Decreased range of motion.       Arms:     Cervical back: Neck supple. No erythema. Pain with movement and muscular tenderness present. No spinous process tenderness. Decreased range of motion.     Comments: Decrease in ROM to left upper extremity due to neck pain.   Skin:    General: Skin is warm and dry.            Assessment & Plan:

## 2020-02-25 ENCOUNTER — Other Ambulatory Visit: Payer: Self-pay | Admitting: Primary Care

## 2020-02-25 DIAGNOSIS — M542 Cervicalgia: Secondary | ICD-10-CM

## 2020-02-25 MED ORDER — PREDNISONE 20 MG PO TABS: ORAL_TABLET | ORAL | 0 refills | Status: DC

## 2020-02-27 DIAGNOSIS — M792 Neuralgia and neuritis, unspecified: Secondary | ICD-10-CM

## 2020-02-27 DIAGNOSIS — M542 Cervicalgia: Secondary | ICD-10-CM

## 2020-02-27 MED ORDER — GABAPENTIN 300 MG PO CAPS: ORAL_CAPSULE | ORAL | 0 refills | Status: DC

## 2020-03-01 ENCOUNTER — Ambulatory Visit (INDEPENDENT_AMBULATORY_CARE_PROVIDER_SITE_OTHER)
Admission: RE | Admit: 2020-03-01 | Discharge: 2020-03-01 | Disposition: A | Discharge status: Home / Self Care | Payer: 59 | Source: Ambulatory Visit | Attending: Primary Care | Admitting: Primary Care

## 2020-03-01 ENCOUNTER — Other Ambulatory Visit: Payer: Self-pay

## 2020-03-01 ENCOUNTER — Other Ambulatory Visit: Payer: Self-pay | Admitting: Primary Care

## 2020-03-01 ENCOUNTER — Ambulatory Visit
Admission: RE | Admit: 2020-03-01 | Discharge: 2020-03-01 | Disposition: A | Discharge status: Home / Self Care | Payer: 59 | Source: Ambulatory Visit | Attending: Primary Care | Admitting: Primary Care

## 2020-03-01 DIAGNOSIS — R29898 Other symptoms and signs involving the musculoskeletal system: Secondary | ICD-10-CM

## 2020-03-01 DIAGNOSIS — M5011 Cervical disc disorder with radiculopathy,  high cervical region: Secondary | ICD-10-CM | POA: Diagnosis not present

## 2020-03-01 DIAGNOSIS — M542 Cervicalgia: Secondary | ICD-10-CM | POA: Insufficient documentation

## 2020-03-01 DIAGNOSIS — M792 Neuralgia and neuritis, unspecified: Secondary | ICD-10-CM

## 2020-03-01 DIAGNOSIS — M50123 Cervical disc disorder at C6-C7 level with radiculopathy: Secondary | ICD-10-CM | POA: Diagnosis not present

## 2020-03-01 DIAGNOSIS — M4802 Spinal stenosis, cervical region: Secondary | ICD-10-CM | POA: Diagnosis not present

## 2020-03-01 DIAGNOSIS — M50121 Cervical disc disorder at C4-C5 level with radiculopathy: Secondary | ICD-10-CM | POA: Diagnosis not present

## 2020-03-05 ENCOUNTER — Other Ambulatory Visit: Payer: Self-pay

## 2020-03-05 ENCOUNTER — Other Ambulatory Visit (HOSPITAL_COMMUNITY)
Admission: RE | Admit: 2020-03-05 | Discharge: 2020-03-05 | Disposition: A | Discharge status: Home / Self Care | Payer: 59 | Source: Ambulatory Visit | Attending: Primary Care | Admitting: Primary Care

## 2020-03-05 ENCOUNTER — Ambulatory Visit (INDEPENDENT_AMBULATORY_CARE_PROVIDER_SITE_OTHER): Payer: 59 | Admitting: Primary Care

## 2020-03-05 ENCOUNTER — Encounter: Payer: Self-pay | Admitting: Primary Care

## 2020-03-05 VITALS — BP 124/78 | HR 95 | Temp 97.9°F | Ht 64.5 in | Wt 170.0 lb

## 2020-03-05 DIAGNOSIS — Z1231 Encounter for screening mammogram for malignant neoplasm of breast: Secondary | ICD-10-CM | POA: Diagnosis not present

## 2020-03-05 DIAGNOSIS — Z8349 Family history of other endocrine, nutritional and metabolic diseases: Secondary | ICD-10-CM | POA: Insufficient documentation

## 2020-03-05 DIAGNOSIS — Z0000 Encounter for general adult medical examination without abnormal findings: Secondary | ICD-10-CM | POA: Diagnosis not present

## 2020-03-05 DIAGNOSIS — Z124 Encounter for screening for malignant neoplasm of cervix: Secondary | ICD-10-CM | POA: Diagnosis not present

## 2020-03-05 DIAGNOSIS — Z114 Encounter for screening for human immunodeficiency virus [HIV]: Secondary | ICD-10-CM | POA: Diagnosis not present

## 2020-03-05 DIAGNOSIS — M542 Cervicalgia: Secondary | ICD-10-CM

## 2020-03-05 DIAGNOSIS — K5909 Other constipation: Secondary | ICD-10-CM | POA: Diagnosis not present

## 2020-03-05 DIAGNOSIS — Z0001 Encounter for general adult medical examination with abnormal findings: Secondary | ICD-10-CM | POA: Insufficient documentation

## 2020-03-05 DIAGNOSIS — L309 Dermatitis, unspecified: Secondary | ICD-10-CM | POA: Diagnosis not present

## 2020-03-05 DIAGNOSIS — Z1159 Encounter for screening for other viral diseases: Secondary | ICD-10-CM | POA: Diagnosis not present

## 2020-03-05 LAB — CBC
HCT: 33.9 % — ABNORMAL LOW (ref 36.0–46.0)
Hemoglobin: 10.6 g/dL — ABNORMAL LOW (ref 12.0–15.0)
MCHC: 31.3 g/dL (ref 30.0–36.0)
MCV: 78.1 fl (ref 78.0–100.0)
Platelets: 507 10*3/uL — ABNORMAL HIGH (ref 150.0–400.0)
RBC: 4.34 Mil/uL (ref 3.87–5.11)
RDW: 15.7 % — ABNORMAL HIGH (ref 11.5–15.5)
WBC: 8.8 10*3/uL (ref 4.0–10.5)

## 2020-03-05 LAB — LIPID PANEL
Cholesterol: 175 mg/dL (ref 0–200)
HDL: 50.1 mg/dL (ref >39.00)
LDL Cholesterol: 89 mg/dL (ref 0–99)
NonHDL: 124.77
Total CHOL/HDL Ratio: 3
Triglycerides: 178 mg/dL — ABNORMAL HIGH (ref 0.0–149.0)
VLDL: 35.6 mg/dL (ref 0.0–40.0)

## 2020-03-05 LAB — COMPREHENSIVE METABOLIC PANEL
ALT: 15 U/L (ref 0–35)
AST: 16 U/L (ref 0–37)
Albumin: 4.1 g/dL (ref 3.5–5.2)
Alkaline Phosphatase: 64 U/L (ref 39–117)
BUN: 11 mg/dL (ref 6–23)
CO2: 29 mEq/L (ref 19–32)
Calcium: 9.4 mg/dL (ref 8.4–10.5)
Chloride: 100 mEq/L (ref 96–112)
Creatinine, Ser: 0.84 mg/dL (ref 0.40–1.20)
GFR: 81.84 mL/min (ref >60.00)
Glucose, Bld: 101 mg/dL — ABNORMAL HIGH (ref 70–99)
Potassium: 4.4 mEq/L (ref 3.5–5.1)
Sodium: 136 mEq/L (ref 135–145)
Total Bilirubin: 0.2 mg/dL (ref 0.2–1.2)
Total Protein: 7.5 g/dL (ref 6.0–8.3)

## 2020-03-05 LAB — HEMOGLOBIN A1C: Hgb A1c MFr Bld: 6.4 % (ref 4.6–6.5)

## 2020-03-05 LAB — TSH: TSH: 2.27 u[IU]/mL (ref 0.35–4.50)

## 2020-03-05 MED ORDER — CLOBETASOL PROP EMOLLIENT BASE 0.05 % EX CREA: TOPICAL_CREAM | CUTANEOUS | 0 refills | Status: DC

## 2020-03-05 MED ORDER — PREDNISONE 20 MG PO TABS: ORAL_TABLET | ORAL | 0 refills | Status: DC

## 2020-03-05 NOTE — Assessment & Plan Note (Signed)
Ongoing and continued.  MRI with degenerative changes, disc protrusion, mild foraminal narrowing.   Will refill steroids, muscle relaxer ineffective. Gabapentin effective.   She has an appointment with neurosurgery next week.

## 2020-03-05 NOTE — Assessment & Plan Note (Signed)
Immunizations UTD. Pap smear due, completed today. Colonoscopy due in 2022. Mammogram due, ordered.  Discussed the importance of a healthy diet and regular exercise in order for weight loss, and to reduce the risk of any potential medical problems.  Exam today stable. Labs pending.

## 2020-03-05 NOTE — Patient Instructions (Addendum)
Stop by the lab prior to leaving today. I will notify you of your results once received.   Resume prednisone course, a new prescription was sent to your pharmacy.  You can take 2 of your gabapentin pills if needed.  Follow up with the neurosurgeon as scheduled.  Continue exercising. You should be getting 150 minutes of moderate intensity exercise weekly.  It's important to improve your diet by reducing consumption of fast food, fried food, processed snack foods, sugary drinks. Increase consumption of fresh vegetables and fruits, whole grains, water.  Ensure you are drinking 64 ounces of water daily.  [facility] the [facility] to schedule your mammogram.   It was a pleasure to see you today!   Preventive Care 35-39 Years Old, Female Preventive care refers to visits with your health care provider and lifestyle choices that can promote health and wellness. This includes:  A yearly physical exam. This may also be called an annual well check.  Regular dental visits and eye exams.  Immunizations.  Screening for certain conditions.  Healthy lifestyle choices, such as eating a healthy diet, getting regular exercise, not using drugs or products that contain nicotine and tobacco, and limiting alcohol use. What can I expect for my preventive care visit? Physical exam Your health care provider will check your:  Height and weight. This may be used to calculate body mass index (BMI), which tells if you are at a healthy weight.  Heart rate and blood pressure.  Skin for abnormal spots. Counseling Your health care provider may ask you questions about your:  Alcohol, tobacco, and drug use.  Emotional well-being.  Home and relationship well-being.  Sexual activity.  Eating habits.  Work and work Statistician.  Method of birth control.  Menstrual cycle.  Pregnancy history. What immunizations do I need?  Influenza (flu) vaccine  This is recommended every year. Tetanus,  diphtheria, and pertussis (Tdap) vaccine  You may need a Td booster every 10 years. Varicella (chickenpox) vaccine  You may need this if you have not been vaccinated. Zoster (shingles) vaccine  You may need this after age 65. Measles, mumps, and rubella (MMR) vaccine  You may need at least one dose of MMR if you were born in 1957 or later. You may also need a second dose. Pneumococcal conjugate (PCV13) vaccine  You may need this if you have certain conditions and were not previously vaccinated. Pneumococcal polysaccharide (PPSV23) vaccine  You may need one or two doses if you smoke cigarettes or if you have certain conditions. Meningococcal conjugate (MenACWY) vaccine  You may need this if you have certain conditions. Hepatitis A vaccine  You may need this if you have certain conditions or if you travel or work in places where you may be exposed to hepatitis A. Hepatitis B vaccine  You may need this if you have certain conditions or if you travel or work in places where you may be exposed to hepatitis B. Haemophilus influenzae type b (Hib) vaccine  You may need this if you have certain conditions. Human papillomavirus (HPV) vaccine  If recommended by your health care provider, you may need three doses over 6 months. You may receive vaccines as individual doses or as more than one vaccine together in one shot (combination vaccines). Talk with your health care provider about the risks and benefits of combination vaccines. What tests do I need? Blood tests  Lipid and cholesterol levels. These may be checked every 5 years, or more frequently if you are  over 63 years old.  Hepatitis C test.  Hepatitis B test. Screening  Lung cancer screening. You may have this screening every year starting at age 7 if you have a 30-pack-year history of smoking and currently smoke or have quit within the past 15 years.  Colorectal cancer screening. All adults should have this screening  starting at age 68 and continuing until age 20. Your health care provider may recommend screening at age 63 if you are at increased risk. You will have tests every 1-10 years, depending on your results and the type of screening test.  Diabetes screening. This is done by checking your blood sugar (glucose) after you have not eaten for a while (fasting). You may have this done every 1-3 years.  Mammogram. This may be done every 1-2 years. Talk with your health care provider about when you should start having regular mammograms. This may depend on whether you have a family history of breast cancer.  BRCA-related cancer screening. This may be done if you have a family history of breast, ovarian, tubal, or peritoneal cancers.  Pelvic exam and Pap test. This may be done every 3 years starting at age 60. Starting at age 6, this may be done every 5 years if you have a Pap test in combination with an HPV test. Other tests  Sexually transmitted disease (STD) testing.  Bone density scan. This is done to screen for osteoporosis. You may have this scan if you are at high risk for osteoporosis. Follow these instructions at home: Eating and drinking  Eat a diet that includes fresh fruits and vegetables, whole grains, lean protein, and low-fat dairy.  Take vitamin and mineral supplements as recommended by your health care provider.  Do not drink alcohol if: ? Your health care provider tells you not to drink. ? You are pregnant, may be pregnant, or are planning to become pregnant.  If you drink alcohol: ? Limit how much you have to 0-1 drink a day. ? Be aware of how much alcohol is in your drink. In the [place] one drink equals one 12 oz bottle of beer (355 mL), one 5 oz glass of wine (148 mL), or one 1 oz glass of hard liquor (44 mL). Lifestyle  Take daily care of your teeth and gums.  Stay active. Exercise for at least 30 minutes on 5 or more days each week.  Do not use any products that contain  nicotine or tobacco, such as cigarettes, e-cigarettes, and chewing tobacco. If you need help quitting, ask your health care provider.  If you are sexually active, practice safe sex. Use a condom or other form of birth control (contraception) in order to prevent pregnancy and STIs (sexually transmitted infections).  If told by your health care provider, take low-dose aspirin daily starting at age 81. What's next?  Visit your health care provider once a year for a well check visit.  Ask your health care provider how often you should have your eyes and teeth checked.  Stay up to date on all vaccines. This information is not intended to replace advice given to you by your health care provider. Make sure you discuss any questions you have with your health care provider. Document Revised: 11/22/2017 Document Reviewed: 11/22/2017 Elsevier Patient Education  2020 [place] have an appointment scheduled for: []   2D Mammogram  [x]   3D Mammogram  []   Bone Density   Call for appointment [place] is [employer]  Your appointment will at the following location  $RemoveB'[]'iWtRAyPN$   [facility]  [place] [address]  [phone]  $RemoveBefor'[]'nBmNuWixDYtm$   [place] [facility])   [address] [place], [place] [address]  [phone]  $RemoveBefor'[]'QHXGKlYWMQnE$   [facility]      [address] [place], [place]         '[]'$   [facility]  [place], [place]  $RemoveB'[]'vMIQYHTP$  [facility]   [address], [place] [address]  $Remo'[]'jwjwd$  [place]  [place] [address] [place],  [address] [phone]    Make sure to wear two peace clothing  No lotions powders or deodorants the day of the appointment Make sure to bring picture ID and insurance  card.  Bring list of medications you are currently taking including any supplements.

## 2020-03-05 NOTE — Assessment & Plan Note (Signed)
In mother.  Checking TSH today.

## 2020-03-05 NOTE — Progress Notes (Signed)
Subjective:    Patient ID: Faith Roth, female    DOB: 08-28-1970, 49 y.o.   MRN: [id number]  HPI  This visit occurred during the SARS-CoV-2 public health emergency.  Safety protocols were in place, including screening questions prior to the visit, additional usage of staff PPE, and extensive cleaning of exam room while observing appropriate contact time as indicated for disinfecting solutions.   [name] is a 49 year old female who presents today for complete physical.  Immunizations: -Tetanus: UTD -Influenza: Completed this season  -Covid-19: Completed series  Diet: She endorses a poor diet. Is trying to get back on track.  Exercise: She is walking some around her neighborhood.   Eye exam: Due Dental exam: Completes semi-annually   Pap Smear: Due Mammogram: Due Colonoscopy: Due in 2022  BP Readings from Last 3 Encounters:  03/05/20 124/78  02/24/20 118/64  12/02/19 130/67   Wt Readings from Last 3 Encounters:  03/05/20 170 lb (77.1 kg)  02/24/20 170 lb 3.2 oz (77.2 kg)  01/09/16 162 lb (73.5 kg)     Review of Systems  Constitutional: Negative for unexpected weight change.  HENT: Negative for rhinorrhea.   Eyes: Negative for visual disturbance.  Respiratory: Negative for cough and shortness of breath.   Cardiovascular: Negative for chest pain.  Gastrointestinal: Positive for constipation. Negative for diarrhea.  Genitourinary: Negative for difficulty urinating and menstrual problem.  Musculoskeletal: Positive for arthralgias, back pain, myalgias and neck pain.       Acute neck pain, chronic lower back pain.  Skin: Negative for rash.  Allergic/Immunologic: Negative for environmental allergies.  Neurological: Positive for numbness. Negative for dizziness and headaches.  Psychiatric/Behavioral: The patient is nervous/anxious.        Able to manage anxiety on her own.       Past Medical History:  Diagnosis Date  . Allergy   . Chicken pox   .  Depression   . Migraines   . UTI (urinary tract infection)      Social History   Socioeconomic History  . Marital status: Single    Spouse name: Not on file  . Number of children: Not on file  . Years of education: Not on file  . Highest education level: Not on file  Occupational History  . Not on file  Tobacco Use  . Smoking status: Never Smoker  . Smokeless tobacco: Never Used  Substance and Sexual Activity  . Alcohol use: No  . Drug use: No  . Sexual activity: Yes    Birth control/protection: Pill  Other Topics Concern  . Not on file  Social History Narrative  . Not on file   Social Determinants of Health   Financial Resource Strain: Not on file  Food Insecurity: Not on file  Transportation Needs: Not on file  Physical Activity: Not on file  Stress: Not on file  Social Connections: Not on file  Intimate Partner Violence: Not on file    Past Surgical History:  Procedure Laterality Date  . CESAREAN SECTION  12/04/2004    Family History  Problem Relation Age of Onset  . Hypertension Mother   . Diabetes Mother   . Heart attack Mother   . Hyperlipidemia Mother   . Kidney disease Mother   . Hypertension Father   . Diabetes Father   . Arthritis Father   . Diabetes Sister     Allergies  Allergen Reactions  . Propofol Other (See Comments)    Wouldn't wake up  from anesthesia Pt states, "couldn't be awaken from surgery."     Current Outpatient Medications on File Prior to Visit  Medication Sig Dispense Refill  . gabapentin (NEURONTIN) 300 MG capsule Take 1 capsule by mouth up to three times daily as needed for nerve pain. 60 capsule 0  . ibuprofen (ADVIL,MOTRIN) 800 MG tablet Take 1 tablet (800 mg total) by mouth 3 (three) times daily. 21 tablet 0  . rizatriptan (MAXALT) 5 MG tablet Take 1 tablet by mouth at migraine onset. May repeat in 2 hours if migraine persists. 10 tablet 0   No current facility-administered medications on file prior to visit.     BP 124/78   Pulse (!) 120   Temp 97.9 F (36.6 C) (Temporal)   Ht 5' 4.5" (1.638 m)   Wt 170 lb (77.1 kg)   LMP 02/27/2020   SpO2 98%   BMI 28.73 kg/m    Objective:   Physical Exam Constitutional:      Appearance: She is well-nourished.  HENT:     Right Ear: Tympanic membrane and ear canal normal.     Left Ear: Tympanic membrane and ear canal normal.     Mouth/Throat:     Mouth: Oropharynx is clear and moist.  Eyes:     Extraocular Movements: EOM normal.     Pupils: Pupils are equal, round, and reactive to light.  Cardiovascular:     Rate and Rhythm: Normal rate and regular rhythm.  Pulmonary:     Effort: Pulmonary effort is normal.     Breath sounds: Normal breath sounds.  Chest:  Breasts: Breasts are symmetrical.     Right: Normal. No axillary adenopathy.     Left: Normal. No axillary adenopathy.    Abdominal:     General: Bowel sounds are normal.     Palpations: Abdomen is soft.     Tenderness: There is no abdominal tenderness.  Genitourinary:    Labia:        Right: No tenderness or lesion.        Left: No tenderness or lesion.      Vagina: Normal.     Cervix: Normal.     Uterus: Normal.      Adnexa: Right adnexa normal.  Musculoskeletal:     Cervical back: Neck supple.     Comments: Moderate decrease in ROM to left upper extremity and neck. Acute from last visit, slightly improved. Reduced strength to left upper extremity.   Lymphadenopathy:     Upper Body:     Right upper body: No axillary adenopathy.     Left upper body: No axillary adenopathy.  Skin:    General: Skin is warm and dry.  Neurological:     Mental Status: She is alert and oriented to person, place, and time.     Cranial Nerves: No cranial nerve deficit.     Deep Tendon Reflexes:     Reflex Scores:      Patellar reflexes are 2+ on the right side and 2+ on the left side. Psychiatric:        Mood and Affect: Mood and affect and mood normal.            Assessment & Plan:

## 2020-03-05 NOTE — Assessment & Plan Note (Signed)
Chronic, uses clobetasol cream PRN. Refill provided today.

## 2020-03-05 NOTE — Assessment & Plan Note (Signed)
Chronic for years, no improvement with daily Miralax. Bowel movements occur every 1-2 weeks. Will start stool softener and Metamucil.   She will update if no improvement, consider Rx treatment.

## 2020-03-08 DIAGNOSIS — Z6828 Body mass index (BMI) 28.0-28.9, adult: Secondary | ICD-10-CM | POA: Diagnosis not present

## 2020-03-08 DIAGNOSIS — M5020 Other cervical disc displacement, unspecified cervical region: Secondary | ICD-10-CM | POA: Diagnosis not present

## 2020-03-08 DIAGNOSIS — R030 Elevated blood-pressure reading, without diagnosis of hypertension: Secondary | ICD-10-CM | POA: Diagnosis not present

## 2020-03-08 DIAGNOSIS — M5412 Radiculopathy, cervical region: Secondary | ICD-10-CM | POA: Diagnosis not present

## 2020-03-08 LAB — HEPATITIS C ANTIBODY
Hepatitis C Ab: NONREACTIVE
SIGNAL TO CUT-OFF: 0.84 (ref <1.00)

## 2020-03-08 LAB — HIV ANTIBODY (ROUTINE TESTING W REFLEX): HIV 1&2 Ab, 4th Generation: NONREACTIVE

## 2020-03-09 ENCOUNTER — Other Ambulatory Visit: Payer: Self-pay

## 2020-03-09 ENCOUNTER — Other Ambulatory Visit: Payer: Self-pay | Admitting: Neurological Surgery

## 2020-03-09 LAB — CYTOLOGY - PAP
Comment: NEGATIVE
Diagnosis: NEGATIVE
Diagnosis: REACTIVE
High risk HPV: NEGATIVE

## 2020-03-09 MED ORDER — CLOBETASOL PROPIONATE 0.05 % EX CREA: 1.0000 "application " | TOPICAL_CREAM | Freq: Two times a day (BID) | CUTANEOUS | 0 refills | Status: DC

## 2020-03-10 ENCOUNTER — Other Ambulatory Visit: Payer: Self-pay | Admitting: Neurological Surgery

## 2020-03-10 DIAGNOSIS — M5412 Radiculopathy, cervical region: Secondary | ICD-10-CM

## 2020-03-15 ENCOUNTER — Other Ambulatory Visit: Payer: Self-pay | Admitting: Primary Care

## 2020-03-15 DIAGNOSIS — M542 Cervicalgia: Secondary | ICD-10-CM

## 2020-03-15 MED ORDER — CYCLOBENZAPRINE HCL 10 MG PO TABS: 10.0000 mg | ORAL_TABLET | Freq: Three times a day (TID) | ORAL | 0 refills | Status: DC | PRN

## 2020-03-17 ENCOUNTER — Other Ambulatory Visit: Payer: Self-pay

## 2020-03-17 ENCOUNTER — Other Ambulatory Visit: Payer: Self-pay | Admitting: Neurological Surgery

## 2020-03-17 ENCOUNTER — Ambulatory Visit
Admission: RE | Admit: 2020-03-17 | Discharge: 2020-03-17 | Disposition: A | Discharge status: Home / Self Care | Payer: 59 | Source: Ambulatory Visit | Attending: Neurological Surgery | Admitting: Neurological Surgery

## 2020-03-17 DIAGNOSIS — M5412 Radiculopathy, cervical region: Secondary | ICD-10-CM

## 2020-03-17 DIAGNOSIS — M5020 Other cervical disc displacement, unspecified cervical region: Secondary | ICD-10-CM | POA: Diagnosis not present

## 2020-03-17 MED ORDER — IOPAMIDOL (ISOVUE-M 300) INJECTION 61%
1.0000 mL | Freq: Once | INTRAMUSCULAR | Status: AC | PRN
  Administered 2020-03-17: 1 mL via EPIDURAL

## 2020-03-17 MED ORDER — TRIAMCINOLONE ACETONIDE 40 MG/ML IJ SUSP (RADIOLOGY)
60.0000 mg | Freq: Once | INTRAMUSCULAR | Status: AC
  Administered 2020-03-17: 60 mg via EPIDURAL

## 2020-03-17 NOTE — Discharge Instructions (Signed)

## 2020-04-15 ENCOUNTER — Telehealth (INDEPENDENT_AMBULATORY_CARE_PROVIDER_SITE_OTHER): Payer: 59 | Admitting: Primary Care

## 2020-04-15 ENCOUNTER — Encounter: Payer: Self-pay | Admitting: Primary Care

## 2020-04-15 VITALS — Ht 64.5 in | Wt 170.0 lb

## 2020-04-15 DIAGNOSIS — R350 Frequency of micturition: Secondary | ICD-10-CM

## 2020-04-15 LAB — POC URINALSYSI DIPSTICK (AUTOMATED)
Bilirubin, UA: NEGATIVE
Blood, UA: NEGATIVE
Glucose, UA: NEGATIVE
Ketones, UA: NEGATIVE
Nitrite, UA: NEGATIVE
Protein, UA: POSITIVE — AB
Spec Grav, UA: 1.02 (ref 1.010–1.025)
Urobilinogen, UA: 0.2 E.U./dL
pH, UA: 6 (ref 5.0–8.0)

## 2020-04-15 NOTE — Patient Instructions (Signed)
We will be in touch regarding your urine specimen results.  Ensure you are consuming 64 ounces of water daily.  It was a pleasure to see you today! [name], NP-C

## 2020-04-15 NOTE — Assessment & Plan Note (Signed)
One week of incomplete bladder emptying, dripping, low back pain. UA today pending. Await results.  Will send culture.

## 2020-04-15 NOTE — Progress Notes (Signed)
Subjective:    Patient ID: Faith Roth, female    DOB: [birth year], 50 y.o.   MRN: [id number]  HPI  Virtual Visit via Video Note  I connected with Darlina Rumpf on 04/15/20 at 11:40 AM EST by a video enabled telemedicine application and verified that I am speaking with the correct person using two identifiers.  Location: Patient: Writer: Office Participants: Patient and myself   I discussed the limitations of evaluation and management by telemedicine and the availability of in person appointments. The patient expressed understanding and agreed to proceed.  History of Present Illness:  Ms. Faith Roth is a 50 year old female who presents today with a chief complaint of incomplete bladder emptying.  She also reports dripping of urine, also darker color to her urine, bilateral lower back pain. She's not been drinking much water lately, but has increased over the last 2 days.  She denies dysuria, foul smell, vaginal discharge, injury/trauma to her back. She has a remote history of UTI's, none recently. Her recent symptoms began one week ago.   Observations/Objective:  Alert and oriented. Appears well, not sickly. No distress. Speaking in complete sentences.  Assessment and Plan:  One week of incomplete bladder emptying, dripping, low back pain. UA today pending. Await results.  Will send culture.  Follow Up Instructions:  We will be in touch regarding your urine specimen results.  Ensure you are consuming 64 ounces of water daily.  It was a pleasure to see you today! [name], NP-C    I discussed the assessment and treatment plan with the patient. The patient was provided an opportunity to ask questions and all were answered. The patient agreed with the plan and demonstrated an understanding of the instructions.   The patient was advised to call back or seek an in-person evaluation if the symptoms worsen or if the condition fails to improve as  anticipated.    Pleas Koch, NP    Review of Systems  Constitutional: Negative for fever.  Genitourinary: Negative for frequency, hematuria, pelvic pain and vaginal discharge.       See HPI  Musculoskeletal: Positive for back pain.       Past Medical History:  Diagnosis Date  . Allergy   . Chicken pox   . Depression   . Migraines   . UTI (urinary tract infection)      Social History   Socioeconomic History  . Marital status: Single    Spouse name: Not on file  . Number of children: Not on file  . Years of education: Not on file  . Highest education level: Not on file  Occupational History  . Not on file  Tobacco Use  . Smoking status: Never Smoker  . Smokeless tobacco: Never Used  Substance and Sexual Activity  . Alcohol use: No  . Drug use: No  . Sexual activity: Yes    Birth control/protection: Pill  Other Topics Concern  . Not on file  Social History Narrative  . Not on file   Social Determinants of Health   Financial Resource Strain: Not on file  Food Insecurity: Not on file  Transportation Needs: Not on file  Physical Activity: Not on file  Stress: Not on file  Social Connections: Not on file  Intimate Partner Violence: Not on file    Past Surgical History:  Procedure Laterality Date  . CESAREAN SECTION  12/04/2004    Family History  Problem Relation Age of Onset  . Hypertension  Mother   . Diabetes Mother   . Heart attack Mother   . Hyperlipidemia Mother   . Kidney disease Mother   . Thyroid disease Mother   . Hypertension Father   . Diabetes Father   . Arthritis Father   . Diabetes Sister     Allergies  Allergen Reactions  . Propofol Other (See Comments)    Wouldn't wake up from anesthesia Pt states, "couldn't be awaken from surgery."     Current Outpatient Medications on File Prior to Visit  Medication Sig Dispense Refill  . clobetasol cream (TEMOVATE) 7.51 % Apply 1 application topically 2 (two) times daily. 30 g 0   . cyclobenzaprine (FLEXERIL) 10 MG tablet Take 1 tablet (10 mg total) by mouth 3 (three) times daily as needed for muscle spasms. 30 tablet 0  . gabapentin (NEURONTIN) 300 MG capsule Take 1 capsule by mouth up to three times daily as needed for nerve pain. 60 capsule 0  . ibuprofen (ADVIL,MOTRIN) 800 MG tablet Take 1 tablet (800 mg total) by mouth 3 (three) times daily. 21 tablet 0  . predniSONE (DELTASONE) 20 MG tablet Take 3 tablets by mouth once daily for two days, then 2 tablets once daily for two days, then 1 tablet once daily for two days. 12 tablet 0  . rizatriptan (MAXALT) 5 MG tablet Take 1 tablet by mouth at migraine onset. May repeat in 2 hours if migraine persists. 10 tablet 0   No current facility-administered medications on file prior to visit.    Ht 5' 4.5" (1.638 m)   Wt 170 lb (77.1 kg)   BMI 28.73 kg/m    Objective:   Physical Exam Constitutional:      General: She is not in acute distress.    Appearance: She is not ill-appearing.  Pulmonary:     Effort: Pulmonary effort is normal.  Neurological:     Mental Status: She is alert and oriented to person, place, and time.  Psychiatric:        Mood and Affect: Mood normal.            Assessment & Plan:

## 2020-04-16 LAB — URINE CULTURE
MICRO NUMBER:: 11438656
SPECIMEN QUALITY:: ADEQUATE

## 2020-04-21 DIAGNOSIS — M5412 Radiculopathy, cervical region: Secondary | ICD-10-CM | POA: Diagnosis not present

## 2020-04-21 DIAGNOSIS — Z6829 Body mass index (BMI) 29.0-29.9, adult: Secondary | ICD-10-CM | POA: Diagnosis not present

## 2020-04-21 DIAGNOSIS — R030 Elevated blood-pressure reading, without diagnosis of hypertension: Secondary | ICD-10-CM | POA: Diagnosis not present

## 2020-04-21 DIAGNOSIS — M5020 Other cervical disc displacement, unspecified cervical region: Secondary | ICD-10-CM | POA: Diagnosis not present

## 2020-06-16 ENCOUNTER — Ambulatory Visit: Payer: 59 | Admitting: Primary Care

## 2020-06-17 ENCOUNTER — Other Ambulatory Visit: Payer: Self-pay

## 2020-06-17 ENCOUNTER — Ambulatory Visit (INDEPENDENT_AMBULATORY_CARE_PROVIDER_SITE_OTHER): Payer: 59 | Admitting: Primary Care

## 2020-06-17 ENCOUNTER — Ambulatory Visit (INDEPENDENT_AMBULATORY_CARE_PROVIDER_SITE_OTHER)
Admission: RE | Admit: 2020-06-17 | Discharge: 2020-06-17 | Disposition: A | Discharge status: Home / Self Care | Payer: 59 | Source: Ambulatory Visit | Attending: Primary Care | Admitting: Primary Care

## 2020-06-17 ENCOUNTER — Encounter: Payer: Self-pay | Admitting: Primary Care

## 2020-06-17 VITALS — BP 118/70 | HR 90 | Temp 97.8°F | Ht 64.5 in | Wt 174.5 lb

## 2020-06-17 DIAGNOSIS — M25521 Pain in right elbow: Secondary | ICD-10-CM | POA: Diagnosis not present

## 2020-06-17 DIAGNOSIS — D649 Anemia, unspecified: Secondary | ICD-10-CM | POA: Diagnosis not present

## 2020-06-17 DIAGNOSIS — G8929 Other chronic pain: Secondary | ICD-10-CM

## 2020-06-17 DIAGNOSIS — M5412 Radiculopathy, cervical region: Secondary | ICD-10-CM | POA: Diagnosis not present

## 2020-06-17 DIAGNOSIS — M79601 Pain in right arm: Secondary | ICD-10-CM | POA: Diagnosis not present

## 2020-06-17 HISTORY — DX: Other chronic pain: G89.29

## 2020-06-17 MED ORDER — PREDNISONE 20 MG PO TABS
ORAL_TABLET | ORAL | 0 refills | Status: DC
Start: 2020-06-17 — End: 2020-08-11

## 2020-06-17 NOTE — Patient Instructions (Signed)
Start prednisone tablets for your arm. Take 3 tablets by mouth once daily for two days, then 2 tablets once daily for three days, then 1 tablet once daily for three days.  Complete xray(s) prior to leaving today. I will notify you of your results once received.  It was a pleasure to see you today!

## 2020-06-17 NOTE — Progress Notes (Signed)
Subjective:    Patient ID: Faith Roth, female    DOB: 24-Feb-1971, 50 y.o.   MRN: [id number]  HPI  Faith Roth is a very pleasant 50 y.o. female with a history of migraines, chronic neck pain, cervical radiculopathy who presents today for follow up and to discuss chronic right upper extremity pain.   Chronic neck pain has improved, nearly resolved since December 2021. Left upper extremity is still tender to the touch, also numbness has nearly resolved to her left upper extremity except for her thumb. Following with neurosurgery. Doing much better.  She's taking gabapentin PRN, overall sparingly, no recent use of cyclobenzaprine.  Chronic right upper extremity pain to her elbow and humeral arm after hitting her posterior right arm on a pole in 2019, worse over the last several months.   She can feel pain to her right elbow with palpation. Two weeks ago she noticed swelling, warmth and pain from the shoulder down through her elbow. She will notice pain at night to her elbow when turning in her bed. She never had her elbow evaluated after the trauma in 2019. She denies numbness. She took one prednisone tablet two weeks ago left over from a prior prescription, noticed reduction in swelling and pain.      BP Readings from Last 3 Encounters:  06/17/20 118/70  03/17/20 (!) 136/93  03/05/20 124/78      Review of Systems  Musculoskeletal: Positive for arthralgias and joint swelling.  Skin: Negative for wound.  Neurological: Positive for numbness.         Past Medical History:  Diagnosis Date  . Allergy   . Chicken pox   . Depression   . Migraines   . UTI (urinary tract infection)     Social History   Socioeconomic History  . Marital status: Single    Spouse name: Not on file  . Number of children: Not on file  . Years of education: Not on file  . Highest education level: Not on file  Occupational History  . Not on file  Tobacco Use  . Smoking status: Never  Smoker  . Smokeless tobacco: Never Used  Substance and Sexual Activity  . Alcohol use: No  . Drug use: No  . Sexual activity: Yes    Birth control/protection: Pill  Other Topics Concern  . Not on file  Social History Narrative  . Not on file   Social Determinants of Health   Financial Resource Strain: Not on file  Food Insecurity: Not on file  Transportation Needs: Not on file  Physical Activity: Not on file  Stress: Not on file  Social Connections: Not on file  Intimate Partner Violence: Not on file    Past Surgical History:  Procedure Laterality Date  . CESAREAN SECTION  12/04/2004    Family History  Problem Relation Age of Onset  . Hypertension Mother   . Diabetes Mother   . Heart attack Mother   . Hyperlipidemia Mother   . Kidney disease Mother   . Thyroid disease Mother   . Hypertension Father   . Diabetes Father   . Arthritis Father   . Diabetes Sister     Allergies  Allergen Reactions  . Propofol Other (See Comments)    Wouldn't wake up from anesthesia Pt states, "couldn't be awaken from surgery."     Current Outpatient Medications on File Prior to Visit  Medication Sig Dispense Refill  . clobetasol cream (TEMOVATE) 0.86 % Apply 1 application topically  2 (two) times daily. 30 g 0  . cyclobenzaprine (FLEXERIL) 10 MG tablet Take 1 tablet (10 mg total) by mouth 3 (three) times daily as needed for muscle spasms. 30 tablet 0  . gabapentin (NEURONTIN) 300 MG capsule Take 1 capsule by mouth up to three times daily as needed for nerve pain. 60 capsule 0  . ibuprofen (ADVIL,MOTRIN) 800 MG tablet Take 1 tablet (800 mg total) by mouth 3 (three) times daily. 21 tablet 0  . predniSONE (DELTASONE) 20 MG tablet Take 3 tablets by mouth once daily for two days, then 2 tablets once daily for two days, then 1 tablet once daily for two days. 12 tablet 0  . rizatriptan (MAXALT) 5 MG tablet Take 1 tablet by mouth at migraine onset. May repeat in 2 hours if migraine persists.  10 tablet 0   No current facility-administered medications on file prior to visit.    BP 118/70   Pulse 90   Temp 97.8 F (36.6 C) (Temporal)   Ht 5' 4.5" (1.638 m)   Wt 174 lb 8 oz (79.2 kg)   SpO2 98%   BMI 29.49 kg/m  Objective:   Physical Exam Pulmonary:     Effort: Pulmonary effort is normal.  Musculoskeletal:       Arms:     Comments: Mild swelling to right lateral and posterior elbow. Bony and soft tissue tenderness. No erythema. Pain with ROM to elbow. Normal ROM to shoulder.   Neurological:     Mental Status: She is alert.           Assessment & Plan:      This visit occurred during the SARS-CoV-2 public health emergency.  Safety protocols were in place, including screening questions prior to the visit, additional usage of staff PPE, and extensive cleaning of exam room while observing appropriate contact time as indicated for disinfecting solutions.

## 2020-06-17 NOTE — Assessment & Plan Note (Signed)
Noted from prior labs, repeat CBC and iron studies pending. No vaginal or rectal bleeding.

## 2020-06-17 NOTE — Assessment & Plan Note (Signed)
Chronic since 2019 with mild trauma.   Checking plain films today. Rx for prednisone taper sent to pharmacy.   She will update.

## 2020-06-17 NOTE — Assessment & Plan Note (Signed)
Significantly improved since injection in December 2021. Following with neurosurgery. Continue PRN gabapentin and cyclobenzaprine.

## 2020-06-17 NOTE — Addendum Note (Signed)
Addended by: Pleas Koch on: 06/17/2020 04:42 PM   Modules accepted: Orders

## 2020-06-18 LAB — CBC
HCT: 29.2 % — ABNORMAL LOW (ref 36.0–46.0)
Hemoglobin: 9.5 g/dL — ABNORMAL LOW (ref 12.0–15.0)
MCHC: 32.4 g/dL (ref 30.0–36.0)
MCV: 75.2 fl — ABNORMAL LOW (ref 78.0–100.0)
Platelets: 530 10*3/uL — ABNORMAL HIGH (ref 150.0–400.0)
RBC: 3.88 Mil/uL (ref 3.87–5.11)
RDW: 15.8 % — ABNORMAL HIGH (ref 11.5–15.5)
WBC: 9.7 10*3/uL (ref 4.0–10.5)

## 2020-06-18 LAB — IBC + FERRITIN
Ferritin: 3.9 ng/mL — ABNORMAL LOW (ref 10.0–291.0)
Iron: <10 ug/dL — ABNORMAL LOW (ref 42–145)
Saturation Ratios: 1.8 % — ABNORMAL LOW (ref 20.0–50.0)
Transferrin: 405 mg/dL — ABNORMAL HIGH (ref 212.0–360.0)

## 2020-06-20 DIAGNOSIS — R7303 Prediabetes: Secondary | ICD-10-CM

## 2020-06-20 DIAGNOSIS — D509 Iron deficiency anemia, unspecified: Secondary | ICD-10-CM

## 2020-06-20 DIAGNOSIS — N939 Abnormal uterine and vaginal bleeding, unspecified: Secondary | ICD-10-CM

## 2020-06-22 NOTE — Telephone Encounter (Signed)
Joellen, please follow up with her closely regarding my message. I think she needs GI evaluation for her anemia, and I thought she was due this year for repeat colonoscopy. Need to get those records.  See my message, have Rollene Fare B. help if needed.

## 2020-06-23 ENCOUNTER — Other Ambulatory Visit: Payer: Self-pay

## 2020-06-23 DIAGNOSIS — D509 Iron deficiency anemia, unspecified: Secondary | ICD-10-CM

## 2020-06-23 NOTE — Telephone Encounter (Signed)
[place] order placed STAT Will you please call patient and also fill in [facility]?

## 2020-06-24 ENCOUNTER — Other Ambulatory Visit: Payer: Self-pay

## 2020-06-24 ENCOUNTER — Ambulatory Visit (HOSPITAL_COMMUNITY)
Admission: RE | Admit: 2020-06-24 | Discharge: 2020-06-24 | Disposition: A | Discharge status: Home / Self Care | Payer: 59 | Source: Ambulatory Visit | Attending: Internal Medicine | Admitting: Internal Medicine

## 2020-06-24 DIAGNOSIS — D509 Iron deficiency anemia, unspecified: Secondary | ICD-10-CM | POA: Insufficient documentation

## 2020-06-24 MED ORDER — SODIUM CHLORIDE 0.9 % IV SOLN
INTRAVENOUS | Status: DC | PRN
  Administered 2020-06-24: 250 mL via INTRAVENOUS

## 2020-06-24 MED ORDER — SODIUM CHLORIDE 0.9 % IV SOLN
510.0000 mg | Freq: Once | INTRAVENOUS | Status: AC
  Administered 2020-06-24: 510 mg via INTRAVENOUS
  Filled 2020-06-24: qty 510

## 2020-06-24 NOTE — Progress Notes (Signed)
PATIENT CARE CENTER NOTE  Diagnosis: Iron deficiency anemia, unspecified iron deficiency anemia type (D50.9)   Provider: Alma Friendly, NP   Procedure: Feraheme infusion    Note: Patient received Feraheme infusion via PIV. Tolerated well with no adverse reaction. Observed patient for 30 minutes post-infusion. Vital signs stable. Discharge instructions given. Order only for one dose. Patient alert, oriented and ambulatory at discharge.

## 2020-06-24 NOTE — Discharge Instructions (Signed)
Ferumoxytol injection What is this medicine? FERUMOXYTOL is an iron complex. Iron is used to make healthy red blood cells, which carry oxygen and nutrients throughout the body. This medicine is used to treat iron deficiency anemia. This medicine may be used for other purposes; ask your health care provider or pharmacist if you have questions. COMMON BRAND NAME(S): Feraheme What should I tell my health care provider before I take this medicine? They need to know if you have any of these conditions:  anemia not caused by low iron levels  high levels of iron in the blood  magnetic resonance imaging (MRI) test scheduled  an unusual or allergic reaction to iron, other medicines, foods, dyes, or preservatives  pregnant or trying to get pregnant  breast-feeding How should I use this medicine? This medicine is for injection into a vein. It is given by a health care professional in a hospital or clinic setting. Talk to your pediatrician regarding the use of this medicine in children. Special care may be needed. Overdosage: If you think you have taken too much of this medicine contact a poison control center or emergency room at once. NOTE: This medicine is only for you. Do not share this medicine with others. What if I miss a dose? It is important not to miss your dose. Call your doctor or health care professional if you are unable to keep an appointment. What may interact with this medicine? This medicine may interact with the following medications:  other iron products This list may not describe all possible interactions. Give your health care provider a list of all the medicines, herbs, non-prescription drugs, or dietary supplements you use. Also tell them if you smoke, drink alcohol, or use illegal drugs. Some items may interact with your medicine. What should I watch for while using this medicine? Visit your doctor or healthcare professional regularly. Tell your doctor or healthcare  professional if your symptoms do not start to get better or if they get worse. You may need blood work done while you are taking this medicine. You may need to follow a special diet. Talk to your doctor. Foods that contain iron include: whole grains/cereals, dried fruits, beans, or peas, leafy green vegetables, and organ meats (liver, kidney). What side effects may I notice from receiving this medicine? Side effects that you should report to your doctor or health care professional as soon as possible:  allergic reactions like skin rash, itching or hives, swelling of the face, lips, or tongue  breathing problems  changes in blood pressure  feeling faint or lightheaded, falls  fever or chills  flushing, sweating, or hot feelings  swelling of the ankles or feet Side effects that usually do not require medical attention (report to your doctor or health care professional if they continue or are bothersome):  diarrhea  headache  nausea, vomiting  stomach pain This list may not describe all possible side effects. Call your doctor for medical advice about side effects. You may report side effects to FDA at 1-800-FDA-1088. Where should I keep my medicine? This drug is given in a hospital or clinic and will not be stored at home. NOTE: This sheet is a summary. It may not cover all possible information. If you have questions about this medicine, talk to your doctor, pharmacist, or health care provider.  2021 Elsevier/Gold Standard (2016-05-01 20:21:10)  

## 2020-06-25 ENCOUNTER — Ambulatory Visit (INDEPENDENT_AMBULATORY_CARE_PROVIDER_SITE_OTHER): Payer: 59

## 2020-06-25 DIAGNOSIS — R7303 Prediabetes: Secondary | ICD-10-CM

## 2020-06-25 LAB — POCT GLYCOSYLATED HEMOGLOBIN (HGB A1C): Hemoglobin A1C: 6.2 % — AB (ref 4.0–5.6)

## 2020-06-25 NOTE — Progress Notes (Cosign Needed)
Pt came in office for A1C for PCP.

## 2020-06-28 ENCOUNTER — Ambulatory Visit
Admission: RE | Admit: 2020-06-28 | Discharge: 2020-06-28 | Disposition: A | Discharge status: Home / Self Care | Payer: 59 | Source: Ambulatory Visit | Attending: Primary Care | Admitting: Primary Care

## 2020-06-28 DIAGNOSIS — D509 Iron deficiency anemia, unspecified: Secondary | ICD-10-CM

## 2020-06-28 DIAGNOSIS — D259 Leiomyoma of uterus, unspecified: Secondary | ICD-10-CM | POA: Diagnosis not present

## 2020-06-28 DIAGNOSIS — N939 Abnormal uterine and vaginal bleeding, unspecified: Secondary | ICD-10-CM | POA: Diagnosis not present

## 2020-07-13 ENCOUNTER — Other Ambulatory Visit: Payer: Self-pay | Admitting: Primary Care

## 2020-07-13 DIAGNOSIS — L309 Dermatitis, unspecified: Secondary | ICD-10-CM

## 2020-07-19 ENCOUNTER — Other Ambulatory Visit: Payer: Self-pay

## 2020-07-19 DIAGNOSIS — L309 Dermatitis, unspecified: Secondary | ICD-10-CM

## 2020-07-19 MED ORDER — CLOBETASOL PROPIONATE 0.05 % EX CREA: TOPICAL_CREAM | CUTANEOUS | 0 refills | Status: DC

## 2020-08-11 ENCOUNTER — Encounter: Payer: Self-pay | Admitting: Primary Care

## 2020-08-11 ENCOUNTER — Ambulatory Visit (INDEPENDENT_AMBULATORY_CARE_PROVIDER_SITE_OTHER)
Admission: RE | Admit: 2020-08-11 | Discharge: 2020-08-11 | Disposition: A | Discharge status: Home / Self Care | Payer: 59 | Source: Ambulatory Visit | Attending: Primary Care | Admitting: Primary Care

## 2020-08-11 ENCOUNTER — Ambulatory Visit (INDEPENDENT_AMBULATORY_CARE_PROVIDER_SITE_OTHER): Payer: 59 | Admitting: Primary Care

## 2020-08-11 ENCOUNTER — Other Ambulatory Visit: Payer: Self-pay

## 2020-08-11 VITALS — BP 124/82 | HR 84 | Temp 98.0°F | Ht 64.5 in | Wt 176.0 lb

## 2020-08-11 DIAGNOSIS — Z01818 Encounter for other preprocedural examination: Secondary | ICD-10-CM | POA: Diagnosis not present

## 2020-08-11 DIAGNOSIS — N939 Abnormal uterine and vaginal bleeding, unspecified: Secondary | ICD-10-CM

## 2020-08-11 DIAGNOSIS — K5909 Other constipation: Secondary | ICD-10-CM | POA: Diagnosis not present

## 2020-08-11 DIAGNOSIS — R7303 Prediabetes: Secondary | ICD-10-CM

## 2020-08-11 DIAGNOSIS — D509 Iron deficiency anemia, unspecified: Secondary | ICD-10-CM

## 2020-08-11 DIAGNOSIS — M5412 Radiculopathy, cervical region: Secondary | ICD-10-CM

## 2020-08-11 HISTORY — DX: Abnormal uterine and vaginal bleeding, unspecified: N93.9

## 2020-08-11 LAB — POC URINALSYSI DIPSTICK (AUTOMATED)
Bilirubin, UA: NEGATIVE
Blood, UA: NEGATIVE
Glucose, UA: NEGATIVE
Ketones, UA: NEGATIVE
Leukocytes, UA: NEGATIVE
Nitrite, UA: NEGATIVE
Protein, UA: POSITIVE — AB
Spec Grav, UA: >=1.03 — AB
Urobilinogen, UA: 0.2 U/dL
pH, UA: 5.5

## 2020-08-11 MED ORDER — IRON (FERROUS SULFATE) 325 (65 FE) MG PO TABS: 325.0000 mg | ORAL_TABLET | Freq: Every day | ORAL | 1 refills | Status: DC

## 2020-08-11 NOTE — Assessment & Plan Note (Signed)
Stable and doing well despite oral iron. Continue to monitor.

## 2020-08-11 NOTE — Assessment & Plan Note (Signed)
Chronic and continued, following with neurosurgery.  Overall exam today demonstrates improvement.

## 2020-08-11 NOTE — Assessment & Plan Note (Addendum)
She is pending drain less abdominoplasty with 2 areas of liposuction for June 9 in [place].  Exam today stable. Labs pending. Chest x-ray pending.  EKG today with normal sinus rhythm, rate of 85.  No PAC/PVCs, acute ST changes.  No prior EKG to compare.  She should be cleared pending lab results.

## 2020-08-11 NOTE — Assessment & Plan Note (Signed)
Recent A1c of 6.2 which is improved from 6.4 several months prior.  Repeat A1c pending for surgical clearance

## 2020-08-11 NOTE — Progress Notes (Signed)
Subjective:    Patient ID: Faith Roth, female    DOB: 1970/07/09, 50 y.o.   MRN: [id number]  HPI  Faith Roth is a very pleasant 50 y.o. female with a history of menorrhagia with abnormal cycles, prediabetes, iron deficiency anemia, chronic constipation, cervical radiculopathy who presents today for surgical clearance.  She is pending a drain less abdominoplasty with two areas of liposuction for September 02, 2020 in [place].   Since our last visit she's undergone one iron infusion for profound iron deficiency anemia secondary to menorrhagia with abnormal bleeding.  Since the iron infusion she's feeling much better in terms of energy levels, she has no longer eating crushed ice.  Her menstrual cycle in May was 7 days, moderate flow, no clots. She did bleed heavily in January, February, and March for 7 days each month. She underwent transvaginal ultrasound in April 2022 which showed leiomyomata and suspected adenomyosis. She is still taking oral iron OTC, would like RX.  She does not see gynecology.  BP Readings from Last 3 Encounters:  08/11/20 124/82  06/24/20 114/70  06/17/20 118/70        Review of Systems  Constitutional: Negative for unexpected weight change.  HENT: Negative for rhinorrhea.   Respiratory: Negative for cough and shortness of breath.   Cardiovascular: Negative for chest pain.  Gastrointestinal: Negative for constipation and diarrhea.  Genitourinary: Positive for menstrual problem. Negative for difficulty urinating.       See HPI  Musculoskeletal: Negative for arthralgias and myalgias.  Skin: Negative for rash.  Allergic/Immunologic: Negative for environmental allergies.  Neurological: Positive for numbness. Negative for dizziness and headaches.         Past Medical History:  Diagnosis Date  . Allergy   . Chicken pox   . Depression   . Migraines   . UTI (urinary tract infection)     Social History   Socioeconomic History  . Marital status:  Single    Spouse name: Not on file  . Number of children: Not on file  . Years of education: Not on file  . Highest education level: Not on file  Occupational History  . Not on file  Tobacco Use  . Smoking status: Never Smoker  . Smokeless tobacco: Never Used  Substance and Sexual Activity  . Alcohol use: No  . Drug use: No  . Sexual activity: Yes    Birth control/protection: Pill  Other Topics Concern  . Not on file  Social History Narrative  . Not on file   Social Determinants of Health   Financial Resource Strain: Not on file  Food Insecurity: Not on file  Transportation Needs: Not on file  Physical Activity: Not on file  Stress: Not on file  Social Connections: Not on file  Intimate Partner Violence: Not on file    Past Surgical History:  Procedure Laterality Date  . CESAREAN SECTION  12/04/2004    Family History  Problem Relation Age of Onset  . Hypertension Mother   . Diabetes Mother   . Heart attack Mother   . Hyperlipidemia Mother   . Kidney disease Mother   . Thyroid disease Mother   . Hypertension Father   . Diabetes Father   . Arthritis Father   . Diabetes Sister     Allergies  Allergen Reactions  . Propofol Other (See Comments)    Wouldn't wake up from anesthesia Pt states, "couldn't be awaken from surgery."     Current Outpatient Medications on File  Prior to Visit  Medication Sig Dispense Refill  . clobetasol cream (TEMOVATE) 0.05 % APPLY TO AFFECTED AREA TWICE A DAY 30 g 0  . cyclobenzaprine (FLEXERIL) 10 MG tablet Take 1 tablet (10 mg total) by mouth 3 (three) times daily as needed for muscle spasms. 30 tablet 0  . gabapentin (NEURONTIN) 300 MG capsule Take 1 capsule by mouth up to three times daily as needed for nerve pain. 60 capsule 0  . ibuprofen (ADVIL,MOTRIN) 800 MG tablet Take 1 tablet (800 mg total) by mouth 3 (three) times daily. 21 tablet 0  . rizatriptan (MAXALT) 5 MG tablet Take 1 tablet by mouth at migraine onset. May repeat  in 2 hours if migraine persists. 10 tablet 0   No current facility-administered medications on file prior to visit.    BP 124/82   Pulse 84   Temp 98 F (36.7 C) (Temporal)   Ht 5' 4.5" (1.638 m)   Wt 176 lb (79.8 kg)   SpO2 98%   BMI 29.74 kg/m  Objective:   Physical Exam HENT:     Right Ear: Tympanic membrane and ear canal normal.     Left Ear: Tympanic membrane and ear canal normal.     Nose: Nose normal.  Eyes:     Conjunctiva/sclera: Conjunctivae normal.     Pupils: Pupils are equal, round, and reactive to light.  Neck:     Thyroid: No thyromegaly.  Cardiovascular:     Rate and Rhythm: Normal rate and regular rhythm.     Heart sounds: No murmur heard.   Pulmonary:     Effort: Pulmonary effort is normal.     Breath sounds: Normal breath sounds. No rales.  Abdominal:     General: Bowel sounds are normal.     Palpations: Abdomen is soft.     Tenderness: There is no abdominal tenderness.  Musculoskeletal:        General: Normal range of motion.     Cervical back: Neck supple.  Lymphadenopathy:     Cervical: No cervical adenopathy.  Skin:    General: Skin is warm and dry.     Findings: No rash.  Neurological:     Mental Status: She is alert and oriented to person, place, and time.     Cranial Nerves: No cranial nerve deficit.     Deep Tendon Reflexes: Reflexes are normal and symmetric.  Psychiatric:        Mood and Affect: Mood normal.           Assessment & Plan:      This visit occurred during the SARS-CoV-2 public health emergency.  Safety protocols were in place, including screening questions prior to the visit, additional usage of staff PPE, and extensive cleaning of exam room while observing appropriate contact time as indicated for disinfecting solutions.

## 2020-08-11 NOTE — Assessment & Plan Note (Signed)
Profound iron deficiency anemia in March 2022, has completed 1 infusion and is feeling better.  Repeat CBC and iron studies pending.  Referral placed to GYN for evaluation of menorrhagia which is suspected to be causing anemia.

## 2020-08-11 NOTE — Patient Instructions (Signed)
Stop by the lab prior to leaving today. I will notify you of your results once received.   You will be contacted regarding your referral to Gynecology.  Please let us know if you have not been contacted within two weeks.   It was a pleasure to see you today!

## 2020-08-11 NOTE — Assessment & Plan Note (Signed)
Menorrhagia with regular and irregular cycles, abnormal uterine bleeding.  Given ultrasound report coupled with profound iron deficiency anemia and menorrhagia, will send to GYN for evaluation.

## 2020-08-12 LAB — CBC WITH DIFFERENTIAL/PLATELET
Basophils Absolute: 0.1 10*3/uL (ref 0.0–0.1)
Basophils Relative: 0.9 % (ref 0.0–3.0)
Eosinophils Absolute: 0.2 10*3/uL (ref 0.0–0.7)
Eosinophils Relative: 2.5 % (ref 0.0–5.0)
HCT: 36.9 % (ref 36.0–46.0)
Hemoglobin: 12.2 g/dL (ref 12.0–15.0)
Lymphocytes Relative: 30 % (ref 12.0–46.0)
Lymphs Abs: 2.3 10*3/uL (ref 0.7–4.0)
MCHC: 33.1 g/dL (ref 30.0–36.0)
MCV: 80.1 fl (ref 78.0–100.0)
Monocytes Absolute: 0.8 10*3/uL (ref 0.1–1.0)
Monocytes Relative: 10.5 % (ref 3.0–12.0)
Neutro Abs: 4.3 10*3/uL (ref 1.4–7.7)
Neutrophils Relative %: 56.1 % (ref 43.0–77.0)
Platelets: 475 10*3/uL — ABNORMAL HIGH (ref 150.0–400.0)
RBC: 4.61 Mil/uL (ref 3.87–5.11)
RDW: 21.4 % — ABNORMAL HIGH (ref 11.5–15.5)
WBC: 7.6 10*3/uL (ref 4.0–10.5)

## 2020-08-12 LAB — IBC + FERRITIN
Ferritin: 12.8 ng/mL (ref 10.0–291.0)
Iron: 36 ug/dL — ABNORMAL LOW (ref 42–145)
Saturation Ratios: 7.8 % — ABNORMAL LOW (ref 20.0–50.0)
Transferrin: 331 mg/dL (ref 212.0–360.0)

## 2020-08-12 LAB — COMPREHENSIVE METABOLIC PANEL
ALT: 12 U/L (ref 0–35)
AST: 14 U/L (ref 0–37)
Albumin: 4.3 g/dL (ref 3.5–5.2)
Alkaline Phosphatase: 76 U/L (ref 39–117)
BUN: 14 mg/dL (ref 6–23)
CO2: 29 mEq/L (ref 19–32)
Calcium: 9.6 mg/dL (ref 8.4–10.5)
Chloride: 102 mEq/L (ref 96–112)
Creatinine, Ser: 0.73 mg/dL (ref 0.40–1.20)
GFR: 96.56 mL/min (ref >60.00)
Glucose, Bld: 94 mg/dL (ref 70–99)
Potassium: 4 mEq/L (ref 3.5–5.1)
Sodium: 138 mEq/L (ref 135–145)
Total Bilirubin: 0.2 mg/dL (ref 0.2–1.2)
Total Protein: 7.7 g/dL (ref 6.0–8.3)

## 2020-08-12 LAB — HCG, QUANTITATIVE, PREGNANCY: Quantitative HCG: <0.6 m[IU]/mL

## 2020-08-12 LAB — HIV ANTIBODY (ROUTINE TESTING W REFLEX): HIV 1&2 Ab, 4th Generation: NONREACTIVE

## 2020-08-12 LAB — PROTIME-INR
INR: 0.9 ratio (ref 0.8–1.0)
Prothrombin Time: 10.1 s (ref 9.6–13.1)

## 2020-08-12 LAB — APTT: aPTT: 29.3 s (ref 23.4–32.7)

## 2020-08-12 LAB — HEMOGLOBIN A1C: Hgb A1c MFr Bld: 5.9 % (ref 4.6–6.5)

## 2020-08-13 DIAGNOSIS — D509 Iron deficiency anemia, unspecified: Secondary | ICD-10-CM

## 2020-08-20 ENCOUNTER — Other Ambulatory Visit: Payer: Self-pay

## 2020-08-20 ENCOUNTER — Other Ambulatory Visit (INDEPENDENT_AMBULATORY_CARE_PROVIDER_SITE_OTHER): Payer: 59

## 2020-08-20 DIAGNOSIS — D509 Iron deficiency anemia, unspecified: Secondary | ICD-10-CM | POA: Diagnosis not present

## 2020-08-20 LAB — CBC WITH DIFFERENTIAL/PLATELET
Basophils Absolute: 0.1 10*3/uL (ref 0.0–0.1)
Basophils Relative: 1.4 % (ref 0.0–3.0)
Eosinophils Absolute: 0.1 10*3/uL (ref 0.0–0.7)
Eosinophils Relative: 1.8 % (ref 0.0–5.0)
HCT: 37.1 % (ref 36.0–46.0)
Hemoglobin: 12.3 g/dL (ref 12.0–15.0)
Lymphocytes Relative: 30.5 % (ref 12.0–46.0)
Lymphs Abs: 2.3 10*3/uL (ref 0.7–4.0)
MCHC: 33.3 g/dL (ref 30.0–36.0)
MCV: 80.3 fl (ref 78.0–100.0)
Monocytes Absolute: 0.7 10*3/uL (ref 0.1–1.0)
Monocytes Relative: 9.9 % (ref 3.0–12.0)
Neutro Abs: 4.2 10*3/uL (ref 1.4–7.7)
Neutrophils Relative %: 56.4 % (ref 43.0–77.0)
Platelets: 422 10*3/uL — ABNORMAL HIGH (ref 150.0–400.0)
RBC: 4.62 Mil/uL (ref 3.87–5.11)
RDW: 21.4 % — ABNORMAL HIGH (ref 11.5–15.5)
WBC: 7.5 10*3/uL (ref 4.0–10.5)

## 2020-08-24 LAB — PATHOLOGIST SMEAR REVIEW

## 2020-08-31 DIAGNOSIS — Z03818 Encounter for observation for suspected exposure to other biological agents ruled out: Secondary | ICD-10-CM | POA: Diagnosis not present

## 2020-09-21 ENCOUNTER — Ambulatory Visit
Admission: RE | Admit: 2020-09-21 | Discharge: 2020-09-21 | Disposition: A | Discharge status: Home / Self Care | Payer: 59 | Source: Ambulatory Visit | Attending: Primary Care | Admitting: Primary Care

## 2020-09-21 ENCOUNTER — Other Ambulatory Visit: Payer: Self-pay

## 2020-09-21 DIAGNOSIS — Z1231 Encounter for screening mammogram for malignant neoplasm of breast: Secondary | ICD-10-CM

## 2020-09-29 ENCOUNTER — Telehealth: Payer: Self-pay

## 2020-09-29 NOTE — Telephone Encounter (Signed)
LVM for pt to call me about FMLA paperwork

## 2020-09-30 NOTE — Telephone Encounter (Signed)
Semi completed paperwork placed in PCP's inbox for completion @ pt visit on 10/01/2020

## 2020-10-01 ENCOUNTER — Ambulatory Visit: Payer: 59 | Admitting: Primary Care

## 2020-10-01 ENCOUNTER — Encounter: Payer: Self-pay | Admitting: Primary Care

## 2020-10-01 ENCOUNTER — Other Ambulatory Visit: Payer: Self-pay

## 2020-10-01 DIAGNOSIS — Z9889 Other specified postprocedural states: Secondary | ICD-10-CM | POA: Insufficient documentation

## 2020-10-01 NOTE — Patient Instructions (Signed)
We will send in your FMLA paperwork.   Let's meet back up virtually or in person in 1 month to evaluation your progress.   It was a pleasure to see you today!

## 2020-10-01 NOTE — Telephone Encounter (Signed)
Completed and placed on Tamera's desk.

## 2020-10-01 NOTE — Assessment & Plan Note (Signed)
No complications operatively and post operatively. Incisions appear well. Abdomen appears to be swelling at expected, mild overall. Soft.   Agree to provide intermittent FMLA, 2 days weekly x 1 month, then re-evaluate. She will set up a visit for follow up at that time.   Encouraged deep breathing exercises.  Lungs clear today.

## 2020-10-01 NOTE — Progress Notes (Signed)
Subjective:    Patient ID: Faith Roth, female    DOB: [birth year], 50 y.o.   MRN: [id number]  HPI  Faith Roth is a very pleasant 50 y.o. female with a history of chronic migraines, cervical radiculopathy, S/P abdominoplasty and liposuction who presents today for post operative follow up.  She underwent abdominoplasty and liposuction in [place], [place] June 9th. Surgery went well, no complications during surgery. She returned home on June 14th, was told by the surgical center to stay 2-3 weeks past surgery but she could stay due to the cost.   Since surgery she's returned back to work. She has noticed symptoms of swelling that causes shortness of breath, muscle tightness and pain, has to stop and rest. Feels sore and swollen to her abdomen. She has had to miss work due to these symptoms. She has good days and bad days, good days include moving faster, less shortness of breath.   She returned to work on June 22nd, was supposed to return on June 20th but missed the 20th and 21st. She has missed June 27th and 28th, couldn't walk, couldn't stand up straight, "couldn't breathe". 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   She was told by the surgeon that she needed 3 months to recover. She is requesting intermittent FMLA due to these symptoms, she is wanting 2 days weekly. 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    Review of Systems  Constitutional:  Negative for chills and fever.  Respiratory:  Positive for shortness of breath.   Musculoskeletal:  Positive for myalgias.  Skin:  Negative for color change and wound.        Past Medical History:  Diagnosis Date   Allergy    Chicken pox    Depression    Migraines    UTI (urinary tract infection)     Social History   Socioeconomic History   Marital status: Single    Spouse name: Not on file   Number of children: Not on file   Years of education: Not on file   Highest education level:  Not on file  Occupational History   Not on file  Tobacco Use   Smoking status: Never   Smokeless tobacco: Never  Substance and Sexual Activity   Alcohol use: No   Drug use: No   Sexual activity: Yes    Birth control/protection: Pill  Other Topics Concern   Not on file  Social History Narrative   Not on file   Social Determinants of Health   Financial Resource Strain: Not on file  Food Insecurity: Not on file  Transportation Needs: Not on file  Physical Activity: Not on file  Stress: Not on file  Social Connections: Not on file  Intimate Partner Violence: Not on file    Past Surgical History:  Procedure Laterality Date   CESAREAN SECTION  12/04/2004    Family History  Problem Relation Age of Onset   Hypertension Mother    Diabetes Mother    Heart attack Mother    Hyperlipidemia Mother    Kidney disease Mother    Thyroid disease Mother    Hypertension Father    Diabetes Father    Arthritis Father    Diabetes Sister     Allergies  Allergen Reactions   Propofol Other (See Comments)    Wouldn't wake up from anesthesia Pt states, "couldn't be awaken from surgery."  Current Outpatient Medications on File Prior to Visit  Medication Sig Dispense Refill   clobetasol cream (TEMOVATE) 0.05 % APPLY TO AFFECTED AREA TWICE A DAY 30 g 0   cyclobenzaprine (FLEXERIL) 10 MG tablet Take 1 tablet (10 mg total) by mouth 3 (three) times daily as needed for muscle spasms. 30 tablet 0   gabapentin (NEURONTIN) 300 MG capsule Take 1 capsule by mouth up to three times daily as needed for nerve pain. 60 capsule 0   ibuprofen (ADVIL,MOTRIN) 800 MG tablet Take 1 tablet (800 mg total) by mouth 3 (three) times daily. 21 tablet 0   Iron, Ferrous Sulfate, 325 (65 Fe) MG TABS Take 325 mg by mouth daily. For iron. 90 tablet 1   rizatriptan (MAXALT) 5 MG tablet Take 1 tablet by mouth at migraine onset. May repeat in 2 hours if migraine persists. 10 tablet 0   No current  facility-administered medications on file prior to visit.    BP 126/82   Pulse 98   Temp 97.7 F (36.5 C) (Temporal)   Ht 5' 4.5" (1.638 m)   Wt 166 lb (75.3 kg)   SpO2 98%   BMI 28.05 kg/m  Objective:   Physical Exam Cardiovascular:     Rate and Rhythm: Normal rate and regular rhythm.  Pulmonary:     Effort: Pulmonary effort is normal.     Breath sounds: Normal breath sounds. No wheezing, rhonchi or rales.  Abdominal:     Palpations: Abdomen is soft.  Musculoskeletal:     Cervical back: Neck supple.  Skin:    General: Skin is warm and dry.     Comments: Incisions appear to be healing well. No erythema or swelling.  Mild generalized abdominal swelling.          Assessment & Plan:      This visit occurred during the SARS-CoV-2 public health emergency.  Safety protocols were in place, including screening questions prior to the visit, additional usage of staff PPE, and extensive cleaning of exam room while observing appropriate contact time as indicated for disinfecting solutions.

## 2020-10-04 NOTE — Telephone Encounter (Signed)
Informed pt paperwork completed and faxed  Copy mailed to pt  Copy for scan   Copy retained by me

## 2020-10-29 ENCOUNTER — Other Ambulatory Visit: Payer: Self-pay

## 2020-10-29 ENCOUNTER — Encounter: Payer: Self-pay | Admitting: Primary Care

## 2020-10-29 ENCOUNTER — Ambulatory Visit: Payer: 59 | Admitting: Primary Care

## 2020-10-29 VITALS — BP 134/62 | HR 82 | Temp 98.8°F | Ht 64.5 in | Wt 165.0 lb

## 2020-10-29 DIAGNOSIS — Z9889 Other specified postprocedural states: Secondary | ICD-10-CM

## 2020-10-29 DIAGNOSIS — R1900 Intra-abdominal and pelvic swelling, mass and lump, unspecified site: Secondary | ICD-10-CM | POA: Diagnosis not present

## 2020-10-29 DIAGNOSIS — R0602 Shortness of breath: Secondary | ICD-10-CM | POA: Diagnosis not present

## 2020-10-29 LAB — CBC WITH DIFFERENTIAL/PLATELET
Basophils Absolute: 0 10*3/uL (ref 0.0–0.1)
Basophils Relative: 0.7 % (ref 0.0–3.0)
Eosinophils Absolute: 0.2 10*3/uL (ref 0.0–0.7)
Eosinophils Relative: 3.4 % (ref 0.0–5.0)
HCT: 34.7 % — ABNORMAL LOW (ref 36.0–46.0)
Hemoglobin: 11.3 g/dL — ABNORMAL LOW (ref 12.0–15.0)
Lymphocytes Relative: 33.3 % (ref 12.0–46.0)
Lymphs Abs: 2 10*3/uL (ref 0.7–4.0)
MCHC: 32.6 g/dL (ref 30.0–36.0)
MCV: 82.2 fl (ref 78.0–100.0)
Monocytes Absolute: 0.7 10*3/uL (ref 0.1–1.0)
Monocytes Relative: 11.1 % (ref 3.0–12.0)
Neutro Abs: 3 10*3/uL (ref 1.4–7.7)
Neutrophils Relative %: 51.5 % (ref 43.0–77.0)
Platelets: 444 10*3/uL — ABNORMAL HIGH (ref 150.0–400.0)
RBC: 4.22 Mil/uL (ref 3.87–5.11)
RDW: 14 % (ref 11.5–15.5)
WBC: 5.9 10*3/uL (ref 4.0–10.5)

## 2020-10-29 NOTE — Assessment & Plan Note (Signed)
Since surgery in June 2022 for abdominoplasty with liposuction. Checking D-dimer today.  Lungs clear.

## 2020-10-29 NOTE — Telephone Encounter (Signed)
LVM for pt informing her that the paperwork was updated and faxed  Copy mailed to pt  Copy for scan   Copy retained by me

## 2020-10-29 NOTE — Progress Notes (Signed)
Subjective:    Patient ID: Faith Roth, female    DOB: 06-27-1970, 49 y.o.   MRN: [id number]  HPI  Faith Roth is a very pleasant 50 y.o. female with a history of prediabetes, iron deficiency anemia, abnormal uterine bleeding who presents today for follow up of abdominoplasty.  She is S/P abdominoplasty and liposuction in [place], [place] on 09/02/20. Evaluated in early July in our office for symptoms of abdominal swelling, shortness of breath, muscle tightness and pain. During this visit she requested FMLA as she's experiencing symptoms throughout her work day. We agreed to provide intermittent FMLA for 2 days weekly x 1 month then to re-evaluate.   Since her last visit she has noticed a decline in swelling to the abdomen, has to move at a slower pace at work, continues with shortness of breath with walking. She has to use a shopping cart to lean on at the grocery store as she cannot walk erect and is short of breath. She continues to feel "pulling" around the naval and around the waist.   She's had to use her intermittent FMLA two days weekly most weeks. She is seeing a massage therapist weekly to help with abdominal swelling and tightness. She is requesting FMLA to be changed to three days weekly and extend for three months (ending around February 02, 2021).  Over the last five days she's noticed increased pain and pressure to her naval. Also with left upper and lower abdominal pain with coughing. She's noticed some intermittent bleeding from the suture sites that began two weeks ago. She's not sure if the bleeding is secondary to the scabbing.   She is having bowel movements once twice weekly which is typical for her.   Review of Systems  Constitutional:  Negative for fever.  Respiratory:  Positive for shortness of breath.   Cardiovascular:  Negative for chest pain.  Musculoskeletal:  Positive for myalgias.       Abdominal swelling and muscular tenderness         Past Medical  History:  Diagnosis Date   Allergy    Chicken pox    Depression    Migraines    UTI (urinary tract infection)     Social History   Socioeconomic History   Marital status: Single    Spouse name: Not on file   Number of children: Not on file   Years of education: Not on file   Highest education level: Not on file  Occupational History   Not on file  Tobacco Use   Smoking status: Never   Smokeless tobacco: Never  Substance and Sexual Activity   Alcohol use: No   Drug use: No   Sexual activity: Yes    Birth control/protection: Pill  Other Topics Concern   Not on file  Social History Narrative   Not on file   Social Determinants of Health   Financial Resource Strain: Not on file  Food Insecurity: Not on file  Transportation Needs: Not on file  Physical Activity: Not on file  Stress: Not on file  Social Connections: Not on file  Intimate Partner Violence: Not on file    Past Surgical History:  Procedure Laterality Date   CESAREAN SECTION  12/04/2004    Family History  Problem Relation Age of Onset   Hypertension Mother    Diabetes Mother    Heart attack Mother    Hyperlipidemia Mother    Kidney disease Mother    Thyroid disease Mother  Hypertension Father    Diabetes Father    Arthritis Father    Diabetes Sister     Allergies  Allergen Reactions   Propofol Other (See Comments)    Wouldn't wake up from anesthesia Pt states, "couldn't be awaken from surgery."     Current Outpatient Medications on File Prior to Visit  Medication Sig Dispense Refill   clobetasol cream (TEMOVATE) 0.05 % APPLY TO AFFECTED AREA TWICE A DAY 30 g 0   cyclobenzaprine (FLEXERIL) 10 MG tablet Take 1 tablet (10 mg total) by mouth 3 (three) times daily as needed for muscle spasms. 30 tablet 0   gabapentin (NEURONTIN) 300 MG capsule Take 1 capsule by mouth up to three times daily as needed for nerve pain. 60 capsule 0   ibuprofen (ADVIL,MOTRIN) 800 MG tablet Take 1 tablet (800  mg total) by mouth 3 (three) times daily. 21 tablet 0   Iron, Ferrous Sulfate, 325 (65 Fe) MG TABS Take 325 mg by mouth daily. For iron. 90 tablet 1   rizatriptan (MAXALT) 5 MG tablet Take 1 tablet by mouth at migraine onset. May repeat in 2 hours if migraine persists. 10 tablet 0   No current facility-administered medications on file prior to visit.    BP 134/62   Pulse 82   Temp 98.8 F (37.1 C) (Temporal)   Ht 5' 4.5" (1.638 m)   Wt 165 lb (74.8 kg)   SpO2 96%   BMI 27.88 kg/m  Objective:   Physical Exam Constitutional:      Appearance: She is not ill-appearing.  Cardiovascular:     Rate and Rhythm: Normal rate and regular rhythm.  Pulmonary:     Effort: Pulmonary effort is normal.     Breath sounds: Normal breath sounds.  Abdominal:     General: Bowel sounds are normal.     Comments: Generalized abdominal swelling  Musculoskeletal:     Comments: Generalized abdominal swelling with generalized tenderness except for RUQ.   Skin:    General: Skin is warm and dry.     Findings: No erythema.     Comments: Slight scabbing noted to inner naval.  Neurological:     Mental Status: She is alert.          Assessment & Plan:      This visit occurred during the SARS-CoV-2 public health emergency.  Safety protocols were in place, including screening questions prior to the visit, additional usage of staff PPE, and extensive cleaning of exam room while observing appropriate contact time as indicated for disinfecting solutions.

## 2020-10-29 NOTE — Assessment & Plan Note (Signed)
Acute since abdominoplasty with liposuction in early June 2022. Overall appears reduced but she continues to struggle with recover.   No obvious infection today. Agree to change intermittent FMLA to three days weekly for three additional months, ending around February 02, 2021.

## 2020-10-29 NOTE — Patient Instructions (Signed)
Stop by the lab prior to leaving today. I will notify you of your results once received.   Please schedule a follow up visit for 2 months.  It was a pleasure to see you today!

## 2020-10-29 NOTE — Assessment & Plan Note (Signed)
Very slow recovery, continues with abdominal swelling and pain.   Checking CBC and D-dimer today given symptoms.  Agree to change FMLA to 3 days weekly, extend for another three months, end around February 02, 2021.  Follow up in 2 months.

## 2020-10-29 NOTE — Telephone Encounter (Signed)
Paperwork amended to reflect 3/days per week for another 3 months  Semi completed paperwork placed in PCP's inbox for review, sign and date

## 2020-10-30 LAB — D-DIMER, QUANTITATIVE: D-Dimer, Quant: 0.51 mcg/mL FEU — ABNORMAL HIGH (ref <0.50)

## 2020-11-01 ENCOUNTER — Telehealth: Payer: Self-pay

## 2020-11-01 NOTE — Telephone Encounter (Signed)
I have called patient and given results that were sent via my chart to patient. She denies any sob or chest pain. No swelling in lets. States that she did not have heavy bleeding with cycle this month but will set up something with GYN if continues. Will call if any changes in symptoms.

## 2020-11-01 NOTE — Telephone Encounter (Signed)
Appears from lab result notes Gentry Fitz NP has already reviewed lab notes. Sending note to Gentry Fitz NP, Lavina Hamman CMA and Terri in lab.

## 2020-11-01 NOTE — Telephone Encounter (Signed)
Noted  

## 2020-11-01 NOTE — Telephone Encounter (Signed)
[facility] Night - Client TELEPHONE ADVICE RECORD [facility] Patient Name: Faith Roth Gender: Female DOB: 1970/10/17 Age: 50 Y 84 M 24 D Return Phone Number: Address: City/ State/ Zip: [place] Client [place] Night - Client Client Site [place] Physician Alma Friendly - NP Contact Type Call Who Is Calling Lab / Radiology Lab Name [facility] Lab Phone Number [phone] Lab Tech Name Welford Roche Reference Number [id number] s Chief Complaint Lab Result (Critical or Stat) Call Type Lab Send to RN Reason for Call Report lab results Initial Comment Caller states she has a critical lab value. Translation No Nurse Assessment Nurse: Verita Schneiders, RN, April Date/Time (Eastern Time): 10/30/2020 8:22:32 AM Confirm and document reason for call. If symptomatic, describe symptoms. ---Caller states she has a critical lab value. Does the patient have any new or worsening symptoms? ---No Nurse: Verita Schneiders, RN, April Date/Time (Eastern Time): 10/30/2020 8:23:43 AM Is there an on-call provider listed? ---Yes Please list name of person reporting value (Lab Employee) and a contact number. ---[phone] Lorriane Shire Please document the following items: Lab name Lab value (read back to lab to verify) Reference range for lab value Date and time blood was drawn Collect time of birth for bilirubin results ---Drawn 10/29/2020 @ [id number] D-Dimer 0.51 Disp. Time Eilene Ghazi Time) Disposition Final User 10/30/2020 8:30:27 AM Lab Call Beams, RN, April Reason: Critical Lab obtained 10/30/2020 8:30:52 AM Clinical Call Yes Beams, RN, April Caller Understands Yes Comments User: April, Beams, RN Date/Time Eilene Ghazi Time): 10/30/2020 8:29:59 AM PLEASE NOTE: All timestamps contained within this report are represented as [place] Standard Time. CONFIDENTIALTY NOTICE: This fax transmission is intended only for the addressee. It contains  information that is legally privileged, confidential or otherwise protected from use or disclosure. If you are not the intended recipient, you are strictly prohibited from reviewing, disclosing, copying using or disseminating any of this information or taking any action in reliance on or regarding this information. If you have received this fax in error, please notify us immediately by telephone so that we can arrange for its return to [place]. Phone: [phone], Toll-Free: [phone], Fax: [phone] Page: 2 of 2 Call Id: [id number] Comments **Per Directives: On Call Provider NOT to be contacted if there is no patient number provided. This information is not present so I will fax information to office.

## 2020-11-02 ENCOUNTER — Telehealth: Payer: 59 | Admitting: Primary Care

## 2020-11-27 ENCOUNTER — Telehealth: Payer: Self-pay | Admitting: Radiology

## 2020-11-27 NOTE — Telephone Encounter (Signed)
Left voicemail for patient to call [facility] to schedule New GYN appointment from referral. Also sent mychart message.

## 2020-12-29 ENCOUNTER — Ambulatory Visit: Payer: 59 | Admitting: Primary Care

## 2021-01-07 ENCOUNTER — Encounter: Payer: Self-pay | Admitting: Primary Care

## 2021-01-07 ENCOUNTER — Other Ambulatory Visit: Payer: Self-pay

## 2021-01-07 ENCOUNTER — Ambulatory Visit: Payer: 59 | Admitting: Primary Care

## 2021-01-07 VITALS — BP 126/78 | HR 88 | Temp 98.0°F | Ht 64.5 in | Wt 166.0 lb

## 2021-01-07 DIAGNOSIS — M5412 Radiculopathy, cervical region: Secondary | ICD-10-CM | POA: Diagnosis not present

## 2021-01-07 DIAGNOSIS — Z8619 Personal history of other infectious and parasitic diseases: Secondary | ICD-10-CM

## 2021-01-07 DIAGNOSIS — Z9889 Other specified postprocedural states: Secondary | ICD-10-CM

## 2021-01-07 DIAGNOSIS — R1900 Intra-abdominal and pelvic swelling, mass and lump, unspecified site: Secondary | ICD-10-CM | POA: Diagnosis not present

## 2021-01-07 MED ORDER — VALACYCLOVIR HCL 1 G PO TABS: ORAL_TABLET | ORAL | 0 refills | Status: DC

## 2021-01-07 NOTE — Assessment & Plan Note (Signed)
Improving gradually, only used FMLA twice since last visit.  She has not healed at this time.  Agree to extend FMLA through December 31st.  Will update paperwork.

## 2021-01-07 NOTE — Patient Instructions (Signed)
You may take the valacyclovir 1000 mg tablets if needed for a cold sore. Take 2 tablets twice daily for one day.   We will extend your FMLA through December 31st.  It was a pleasure to see you today!

## 2021-01-07 NOTE — Progress Notes (Signed)
Subjective:    Patient ID: Faith Roth, female    DOB: 12-17-1970, 50 y.o.   MRN: [id number]  HPI  Faith Roth is a very pleasant 50 y.o. female who presents today for follow up of FMLA.   She was last evaluated on 10/29/20 for recovery after her abdominoplasty and liposuction for which she completed in [place] in early June 2022. During this visit she continued to experience abdominal swelling and pain, shortness of breath, and fatigue. Given these symptoms we extended her FMLA for another three months, tentative end date of November 9th.   Today she is doing better, she's had to use FMLA on two separate occasions since her last visit. She continues to notice abdominal swelling, overall reduced. Her SOB is intermittent, but improved. She can feel the pulling of the muscles in her abdomen.    She spoke with her surgeon's office recently, notified them of her continued swelling. She was instructed to stay in her brace, but that there was nothing else that could be done. She is seeing her massage therapist for lymphatic drainage.    The pain in her left upper extremity has returned a few days ago. She received a cervical spine injection by her neurosurgeon in early 2022. She plans on scheduling an appointment for an injection.    Prior history of cold sores, infrequent episodes, last episode was two weeks ago, resolved about one week ago. She had no treatment, would like to have some Valtrex on hand, was on this before.    Review of Systems  Respiratory:         Improving shortness of breath   Cardiovascular:  Negative for chest pain.  Musculoskeletal:  Positive for myalgias.       Anterior abdominal swelling. Abdominal muscle pain  Skin:  Negative for color change.  Neurological:        Recent cold sore        Past Medical History:  Diagnosis Date   Allergy    Chicken pox    Depression    Migraines    UTI (urinary tract infection)     Social History    Socioeconomic History   Marital status: Single    Spouse name: Not on file   Number of children: Not on file   Years of education: Not on file   Highest education level: Not on file  Occupational History   Not on file  Tobacco Use   Smoking status: Never   Smokeless tobacco: Never  Substance and Sexual Activity   Alcohol use: No   Drug use: No   Sexual activity: Yes    Birth control/protection: Pill  Other Topics Concern   Not on file  Social History Narrative   Not on file   Social Determinants of Health   Financial Resource Strain: Not on file  Food Insecurity: Not on file  Transportation Needs: Not on file  Physical Activity: Not on file  Stress: Not on file  Social Connections: Not on file  Intimate Partner Violence: Not on file    Past Surgical History:  Procedure Laterality Date   CESAREAN SECTION  12/04/2004    Family History  Problem Relation Age of Onset   Hypertension Mother    Diabetes Mother    Heart attack Mother    Hyperlipidemia Mother    Kidney disease Mother    Thyroid disease Mother    Hypertension Father    Diabetes Father    Arthritis  Father    Diabetes Sister     Allergies  Allergen Reactions   Propofol Other (See Comments)    Wouldn't wake up from anesthesia Pt states, "couldn't be awaken from surgery."     Current Outpatient Medications on File Prior to Visit  Medication Sig Dispense Refill   clobetasol cream (TEMOVATE) 0.05 % APPLY TO AFFECTED AREA TWICE A DAY 30 g 0   cyclobenzaprine (FLEXERIL) 10 MG tablet Take 1 tablet (10 mg total) by mouth 3 (three) times daily as needed for muscle spasms. 30 tablet 0   gabapentin (NEURONTIN) 300 MG capsule Take 1 capsule by mouth up to three times daily as needed for nerve pain. 60 capsule 0   ibuprofen (ADVIL,MOTRIN) 800 MG tablet Take 1 tablet (800 mg total) by mouth 3 (three) times daily. 21 tablet 0   Iron, Ferrous Sulfate, 325 (65 Fe) MG TABS Take 325 mg by mouth daily. For iron.  90 tablet 1   rizatriptan (MAXALT) 5 MG tablet Take 1 tablet by mouth at migraine onset. May repeat in 2 hours if migraine persists. 10 tablet 0   No current facility-administered medications on file prior to visit.    BP 126/78   Pulse 88   Temp 98 F (36.7 C) (Temporal)   Ht 5' 4.5" (1.638 m)   Wt 166 lb (75.3 kg)   SpO2 94%   BMI 28.05 kg/m  Objective:   Physical Exam Cardiovascular:     Rate and Rhythm: Normal rate and regular rhythm.  Pulmonary:     Effort: Pulmonary effort is normal.     Breath sounds: Normal breath sounds.  Abdominal:     General: Bowel sounds are normal. There is distension.     Tenderness: There is abdominal tenderness.  Musculoskeletal:     Cervical back: Neck supple.     Comments: Abdominal swelling present, does appear improved compared to last visit.  MSK tenderness to abdominal region  Skin:    General: Skin is warm and dry.  Psychiatric:        Mood and Affect: Mood normal.          Assessment & Plan:      This visit occurred during the SARS-CoV-2 public health emergency.  Safety protocols were in place, including screening questions prior to the visit, additional usage of staff PPE, and extensive cleaning of exam room while observing appropriate contact time as indicated for disinfecting solutions.

## 2021-01-07 NOTE — Assessment & Plan Note (Signed)
Resolved, not evident today.  Rx for Valtrex 1000 mg tablets sent to pharmacy to have on hand for PRN use.

## 2021-01-07 NOTE — Assessment & Plan Note (Signed)
Encouraged patient to schedule a visit with neurosurgeon for another injection.

## 2021-03-11 ENCOUNTER — Other Ambulatory Visit: Payer: Self-pay

## 2021-03-11 ENCOUNTER — Ambulatory Visit (INDEPENDENT_AMBULATORY_CARE_PROVIDER_SITE_OTHER): Payer: 59 | Admitting: Primary Care

## 2021-03-11 ENCOUNTER — Encounter: Payer: Self-pay | Admitting: Primary Care

## 2021-03-11 VITALS — BP 132/74 | HR 96 | Temp 98.2°F | Ht 64.5 in | Wt 163.0 lb

## 2021-03-11 DIAGNOSIS — D649 Anemia, unspecified: Secondary | ICD-10-CM | POA: Diagnosis not present

## 2021-03-11 DIAGNOSIS — Z0000 Encounter for general adult medical examination without abnormal findings: Secondary | ICD-10-CM

## 2021-03-11 DIAGNOSIS — Z9889 Other specified postprocedural states: Secondary | ICD-10-CM | POA: Diagnosis not present

## 2021-03-11 DIAGNOSIS — N939 Abnormal uterine and vaginal bleeding, unspecified: Secondary | ICD-10-CM

## 2021-03-11 DIAGNOSIS — M5412 Radiculopathy, cervical region: Secondary | ICD-10-CM | POA: Diagnosis not present

## 2021-03-11 DIAGNOSIS — K5909 Other constipation: Secondary | ICD-10-CM

## 2021-03-11 DIAGNOSIS — R7303 Prediabetes: Secondary | ICD-10-CM

## 2021-03-11 DIAGNOSIS — Z8619 Personal history of other infectious and parasitic diseases: Secondary | ICD-10-CM

## 2021-03-11 DIAGNOSIS — G43709 Chronic migraine without aura, not intractable, without status migrainosus: Secondary | ICD-10-CM

## 2021-03-11 DIAGNOSIS — R1900 Intra-abdominal and pelvic swelling, mass and lump, unspecified site: Secondary | ICD-10-CM

## 2021-03-11 LAB — CBC
HCT: 33.5 % — ABNORMAL LOW (ref 36.0–46.0)
Hemoglobin: 11 g/dL — ABNORMAL LOW (ref 12.0–15.0)
MCHC: 32.9 g/dL (ref 30.0–36.0)
MCV: 83.6 fl (ref 78.0–100.0)
Platelets: 439 10*3/uL — ABNORMAL HIGH (ref 150.0–400.0)
RBC: 4.01 Mil/uL (ref 3.87–5.11)
RDW: 14.8 % (ref 11.5–15.5)
WBC: 6.3 10*3/uL (ref 4.0–10.5)

## 2021-03-11 LAB — LIPID PANEL
Cholesterol: 163 mg/dL (ref 0–200)
HDL: 53.2 mg/dL (ref >39.00)
LDL Cholesterol: 83 mg/dL (ref 0–99)
NonHDL: 109.46
Total CHOL/HDL Ratio: 3
Triglycerides: 130 mg/dL (ref 0.0–149.0)
VLDL: 26 mg/dL (ref 0.0–40.0)

## 2021-03-11 LAB — IBC + FERRITIN
Ferritin: 5.3 ng/mL — ABNORMAL LOW (ref 10.0–291.0)
Iron: 36 ug/dL — ABNORMAL LOW (ref 42–145)
Saturation Ratios: 7.8 % — ABNORMAL LOW (ref 20.0–50.0)
TIBC: 463.4 ug/dL — ABNORMAL HIGH (ref 250.0–450.0)
Transferrin: 331 mg/dL (ref 212.0–360.0)

## 2021-03-11 LAB — HEMOGLOBIN A1C: Hgb A1c MFr Bld: 6.3 % (ref 4.6–6.5)

## 2021-03-11 MED ORDER — VALACYCLOVIR HCL 1 G PO TABS
2000.0000 mg | ORAL_TABLET | Freq: Two times a day (BID) | ORAL | 0 refills | Status: DC
  Filled 2021-05-16 – 2021-06-28 (×2): qty 12, 3d supply, fill #0

## 2021-03-11 NOTE — Assessment & Plan Note (Signed)
Discussed the importance of a healthy diet and regular exercise in order for weight loss, and to reduce the risk of further co-morbidity. ? ?Repeat A1C pending. ?

## 2021-03-11 NOTE — Assessment & Plan Note (Signed)
Shingrix due but she declines as it is her birthday weekend celebration. Will complete at a later date. Other vaccines UTD.  Pap smear UTD. Colonoscopy due in 2023 per patient.  Discussed the importance of a healthy diet and regular exercise in order for weight loss, and to reduce the risk of further co-morbidity.  Exam today stable. Labs pending

## 2021-03-11 NOTE — Assessment & Plan Note (Addendum)
Two outbreaks since, has been using Valtrex incorrectly. Discussed proper instructions for use.  Refills provided. Discussed to notify if she notices more than 4 outbreaks annually.  Continue PRN use of Valtrex 1000 mg.

## 2021-03-11 NOTE — Addendum Note (Signed)
Addended by: Pleas Koch on: 03/11/2021 02:08 PM   Modules accepted: Orders

## 2021-03-11 NOTE — Assessment & Plan Note (Addendum)
Improved overall, less frequent.  Continue PRN use of rizitriptan for which she takes 2.5 mg PRN.

## 2021-03-11 NOTE — Assessment & Plan Note (Signed)
Secondary to heavy vaginal bleeding, cyclical.   Repeat CBC and iron studies pending. Discussed every other day use of oral iron.  Continue to monitor.

## 2021-03-11 NOTE — Assessment & Plan Note (Addendum)
Improving overall gradually. Continues with left thumb numbness.   Using gabapentin 300 mg PRN, continue same.  Continue to monitor.

## 2021-03-11 NOTE — Assessment & Plan Note (Signed)
Improving gradually. Compliant to [employer] daily.  FMLA good through the end of December 2022, has yet to use FMLA recently.

## 2021-03-11 NOTE — Progress Notes (Signed)
Subjective:    Patient ID: Faith Roth, female    DOB: [birth year], 50 y.o.   MRN: [id number]  HPI  Faith Roth is a very pleasant 50 y.o. female who presents today for complete physical and follow up of chronic conditions.  Immunizations: -Tetanus: 2019 -Influenza: Completed this season  -Covid-19: 2 vaccines -Shingles: Never completed, declines today as it is her birthday.   Diet: Fair diet, smaller appetite Exercise: No regular exercise, active at work.   Eye exam: Needs to schedule  Dental exam: No recent visit   Pap Smear: Completed in 2021 Mammogram: Completed in June 2022 Colonoscopy: Due in 2023  BP Readings from Last 3 Encounters:  03/11/21 132/74  01/07/21 126/78  10/29/20 134/62       Review of Systems  Constitutional:  Negative for unexpected weight change.  HENT:  Negative for rhinorrhea.   Respiratory:  Negative for cough and shortness of breath.   Cardiovascular:  Negative for chest pain.  Gastrointestinal:  Negative for constipation and diarrhea.  Genitourinary:  Negative for difficulty urinating.       Heavy menses, cyclical.  Musculoskeletal:  Positive for myalgias. Negative for arthralgias.  Skin:  Negative for rash.  Allergic/Immunologic: Negative for environmental allergies.  Neurological:  Positive for numbness and headaches. Negative for dizziness.  Psychiatric/Behavioral:  The patient is not nervous/anxious.         Past Medical History:  Diagnosis Date   Allergy    Chicken pox    Depression    Migraines    UTI (urinary tract infection)     Social History   Socioeconomic History   Marital status: Single    Spouse name: Not on file   Number of children: Not on file   Years of education: Not on file   Highest education level: Not on file  Occupational History   Not on file  Tobacco Use   Smoking status: Never   Smokeless tobacco: Never  Substance and Sexual Activity   Alcohol use: No   Drug use: No    Sexual activity: Yes    Birth control/protection: Pill  Other Topics Concern   Not on file  Social History Narrative   Not on file   Social Determinants of Health   Financial Resource Strain: Not on file  Food Insecurity: Not on file  Transportation Needs: Not on file  Physical Activity: Not on file  Stress: Not on file  Social Connections: Not on file  Intimate Partner Violence: Not on file    Past Surgical History:  Procedure Laterality Date   CESAREAN SECTION  12/04/2004    Family History  Problem Relation Age of Onset   Hypertension Mother    Diabetes Mother    Heart attack Mother    Hyperlipidemia Mother    Kidney disease Mother    Thyroid disease Mother    Hypertension Father    Diabetes Father    Arthritis Father    Diabetes Sister     Allergies  Allergen Reactions   Propofol Other (See Comments)    Wouldn't wake up from anesthesia Pt states, "couldn't be awaken from surgery."     Current Outpatient Medications on File Prior to Visit  Medication Sig Dispense Refill   clobetasol cream (TEMOVATE) 0.05 % APPLY TO AFFECTED AREA TWICE A DAY 30 g 0   cyclobenzaprine (FLEXERIL) 10 MG tablet Take 1 tablet (10 mg total) by mouth 3 (three) times daily as needed for muscle spasms.  30 tablet 0   gabapentin (NEURONTIN) 300 MG capsule Take 1 capsule by mouth up to three times daily as needed for nerve pain. 60 capsule 0   ibuprofen (ADVIL,MOTRIN) 800 MG tablet Take 1 tablet (800 mg total) by mouth 3 (three) times daily. 21 tablet 0   Iron, Ferrous Sulfate, 325 (65 Fe) MG TABS Take 325 mg by mouth daily. For iron. 90 tablet 1   rizatriptan (MAXALT) 5 MG tablet Take 1 tablet by mouth at migraine onset. May repeat in 2 hours if migraine persists. 10 tablet 0   valACYclovir (VALTREX) 1000 MG tablet Take 2 tablets by mouth twice daily for one day as needed for cold sore outbreak. 8 tablet 0   No current facility-administered medications on file prior to visit.    There  were no vitals taken for this visit. Objective:   Physical Exam HENT:     Right Ear: Tympanic membrane and ear canal normal.     Left Ear: Tympanic membrane and ear canal normal.     Nose: Nose normal.  Eyes:     Conjunctiva/sclera: Conjunctivae normal.     Pupils: Pupils are equal, round, and reactive to light.  Neck:     Thyroid: No thyromegaly.  Cardiovascular:     Rate and Rhythm: Normal rate and regular rhythm.     Heart sounds: No murmur heard. Pulmonary:     Effort: Pulmonary effort is normal.     Breath sounds: Normal breath sounds. No rales.  Abdominal:     General: Bowel sounds are normal.     Palpations: Abdomen is soft.     Tenderness: There is no abdominal tenderness.  Musculoskeletal:        General: Normal range of motion.     Cervical back: Neck supple.     Comments: Slight decrease in strength to left upper extremity compared to right. This is chronic and unchanged.   Lymphadenopathy:     Cervical: No cervical adenopathy.  Skin:    General: Skin is warm and dry.     Findings: No rash.  Neurological:     Mental Status: She is alert and oriented to person, place, and time.     Cranial Nerves: No cranial nerve deficit.     Deep Tendon Reflexes: Reflexes are normal and symmetric.  Psychiatric:        Mood and Affect: Mood normal.          Assessment & Plan:      This visit occurred during the SARS-CoV-2 public health emergency.  Safety protocols were in place, including screening questions prior to the visit, additional usage of staff PPE, and extensive cleaning of exam room while observing appropriate contact time as indicated for disinfecting solutions.

## 2021-03-11 NOTE — Patient Instructions (Signed)
Stop by the lab prior to leaving today. I will notify you of your results once received.   Set up a nurse visit to complete your Shingles vaccine series. Your second dose is due 2 to 6 months after the first dose.   It was a pleasure to see you today!  Preventive Care 1-50 Years Old, Female Preventive care refers to lifestyle choices and visits with your health care provider that can promote health and wellness. Preventive care visits are also called wellness exams. What can I expect for my preventive care visit? Counseling Your health care provider may ask you questions about your: Medical history, including: Past medical problems. Family medical history. Pregnancy history. Current health, including: Menstrual cycle. Method of birth control. Emotional well-being. Home life and relationship well-being. Sexual activity and sexual health. Lifestyle, including: Alcohol, nicotine or tobacco, and drug use. Access to firearms. Diet, exercise, and sleep habits. Work and work Statistician. Sunscreen use. Safety issues such as seatbelt and bike helmet use. Physical exam Your health care provider will check your: Height and weight. These may be used to calculate your BMI (body mass index). BMI is a measurement that tells if you are at a healthy weight. Waist circumference. This measures the distance around your waistline. This measurement also tells if you are at a healthy weight and may help predict your risk of certain diseases, such as type 2 diabetes and high blood pressure. Heart rate and blood pressure. Body temperature. Skin for abnormal spots. What immunizations do I need? Vaccines are usually given at various ages, according to a schedule. Your health care provider will recommend vaccines for you based on your age, medical history, and lifestyle or other factors, such as travel or where you work. What tests do I need? Screening Your health care provider may recommend screening  tests for certain conditions. This may include: Lipid and cholesterol levels. Diabetes screening. This is done by checking your blood sugar (glucose) after you have not eaten for a while (fasting). Pelvic exam and Pap test. Hepatitis B test. Hepatitis C test. HIV (human immunodeficiency virus) test. STI (sexually transmitted infection) testing, if you are at risk. Lung cancer screening. Colorectal cancer screening. Mammogram. Talk with your health care provider about when you should start having regular mammograms. This may depend on whether you have a family history of breast cancer. BRCA-related cancer screening. This may be done if you have a family history of breast, ovarian, tubal, or peritoneal cancers. Bone density scan. This is done to screen for osteoporosis. Talk with your health care provider about your test results, treatment options, and if necessary, the need for more tests. Follow these instructions at home: Eating and drinking  Eat a diet that includes fresh fruits and vegetables, whole grains, lean protein, and low-fat dairy products. Take vitamin and mineral supplements as recommended by your health care provider. Do not drink alcohol if: Your health care provider tells you not to drink. You are pregnant, may be pregnant, or are planning to become pregnant. If you drink alcohol: Limit how much you have to 0-1 drink a day. Know how much alcohol is in your drink. In the [place] one drink equals one 12 oz bottle of beer (355 mL), one 5 oz glass of wine (148 mL), or one 1 oz glass of hard liquor (44 mL). Lifestyle Brush your teeth every morning and night with fluoride toothpaste. Floss one time each day. Exercise for at least 30 minutes 5 or more days each week.  Do not use any products that contain nicotine or tobacco. These products include cigarettes, chewing tobacco, and vaping devices, such as e-cigarettes. If you need help quitting, ask your health care provider. Do not  use drugs. If you are sexually active, practice safe sex. Use a condom or other form of protection to prevent STIs. If you do not wish to become pregnant, use a form of birth control. If you plan to become pregnant, see your health care provider for a prepregnancy visit. Take aspirin only as told by your health care provider. Make sure that you understand how much to take and what form to take. Work with your health care provider to find out whether it is safe and beneficial for you to take aspirin daily. Find healthy ways to manage stress, such as: Meditation, yoga, or listening to music. Journaling. Talking to a trusted person. Spending time with friends and family. Minimize exposure to UV radiation to reduce your risk of skin cancer. Safety Always wear your seat belt while driving or riding in a vehicle. Do not drive: If you have been drinking alcohol. Do not ride with someone who has been drinking. When you are tired or distracted. While texting. If you have been using any mind-altering substances or drugs. Wear a helmet and other protective equipment during sports activities. If you have firearms in your house, make sure you follow all gun safety procedures. Seek help if you have been physically or sexually abused. What's next? Visit your health care provider once a year for an annual wellness visit. Ask your health care provider how often you should have your eyes and teeth checked. Stay up to date on all vaccines. This information is not intended to replace advice given to you by your health care provider. Make sure you discuss any questions you have with your health care provider. Document Revised: 09/08/2020 Document Reviewed: 09/08/2020 Elsevier Patient Education  [place].

## 2021-03-11 NOTE — Assessment & Plan Note (Signed)
Stable. No concerns today.

## 2021-03-11 NOTE — Assessment & Plan Note (Signed)
Continues to improve gradually. Compliant to abdominal garmet.  Doing well overall and will no longer need FMLA after December 31st, 2022.

## 2021-03-11 NOTE — Assessment & Plan Note (Signed)
Heavy menses intermittently and endorses that this is improving, intermittent use of oral iron.   She never connected with GYN.  Will repeat CBC and iron studies. Await results.

## 2021-03-13 ENCOUNTER — Other Ambulatory Visit: Payer: Self-pay | Admitting: Primary Care

## 2021-03-13 DIAGNOSIS — R7303 Prediabetes: Secondary | ICD-10-CM

## 2021-04-04 IMAGING — US US PELVIS COMPLETE WITH TRANSVAGINAL
1 series · 13 of 25 positions shown · non-contrast
Comparison: No comparison is available aside from CT from 9992.

CLINICAL DATA: Anemia, abnormal vaginal bleeding in a 49-year-old
female, questionable last menstrual. The, not reportedly
postmenopausal.



[Series 1: us pelvis complete with transvaginal · 0.22mm/px · 13 of 64 slices shown]
[im 1/64]
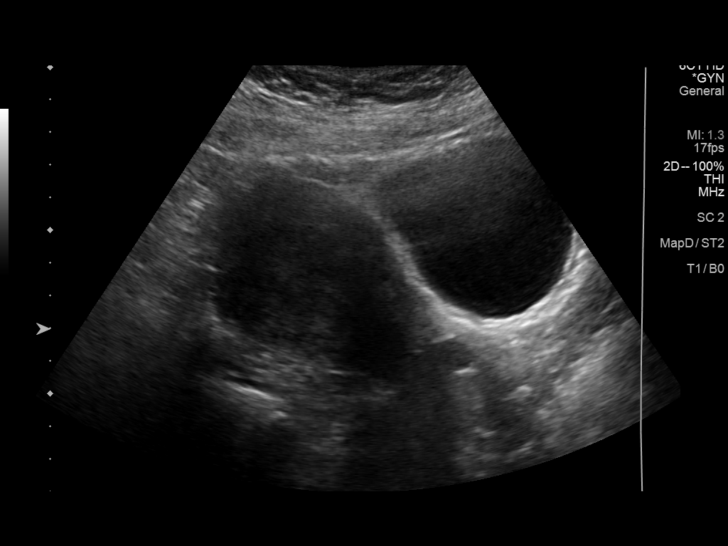
[im 6/64]
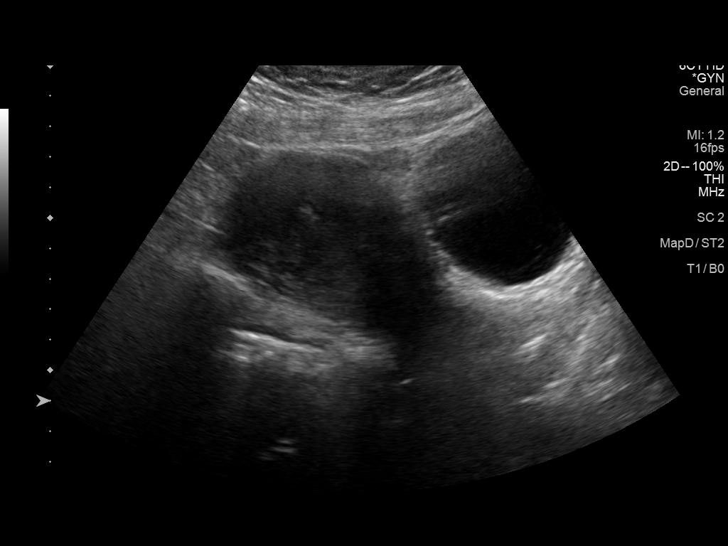
[im 11/64]
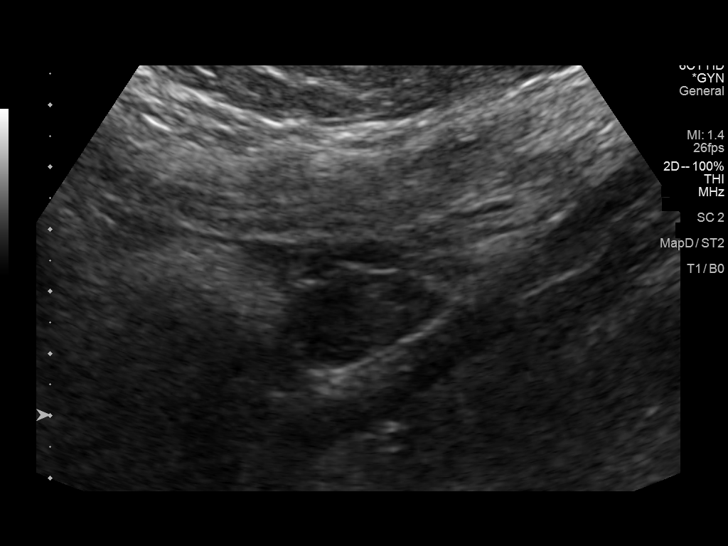
[im 16/64]
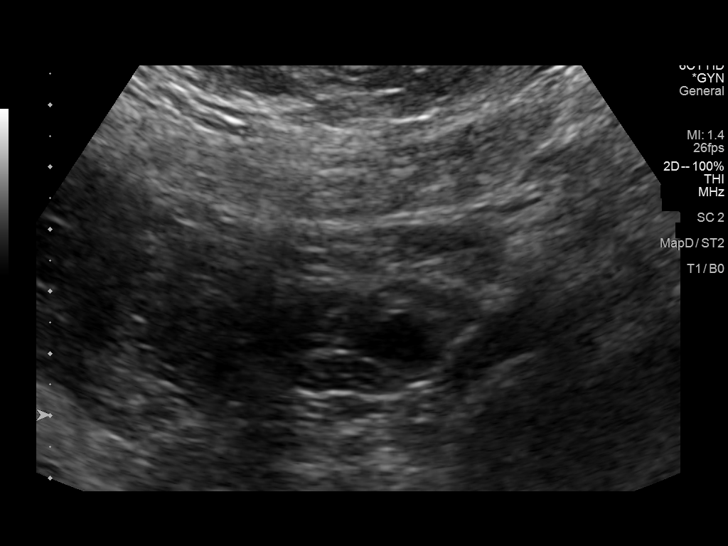
[im 22/64]
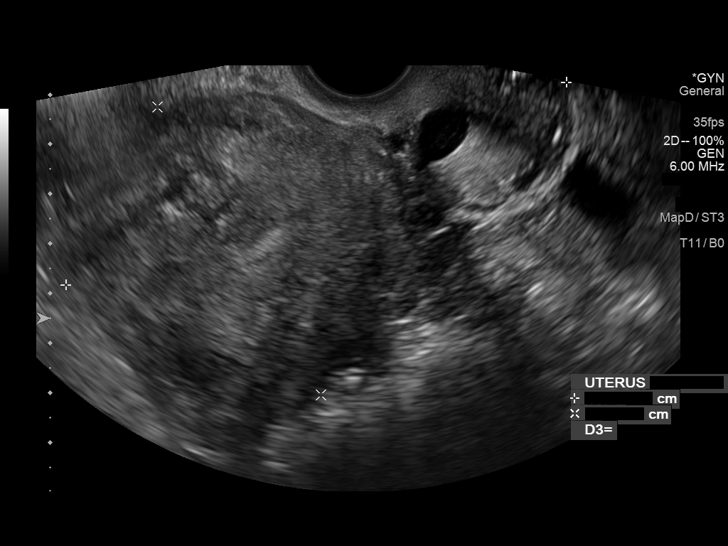
[im 27/64]
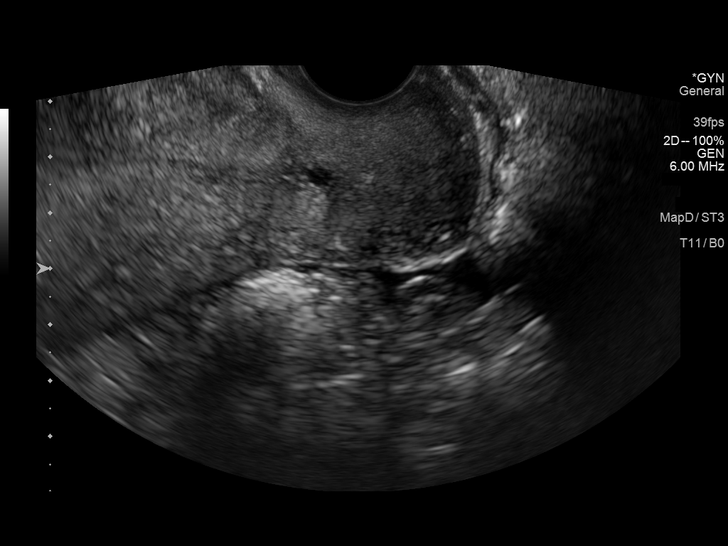
[im 32/64]
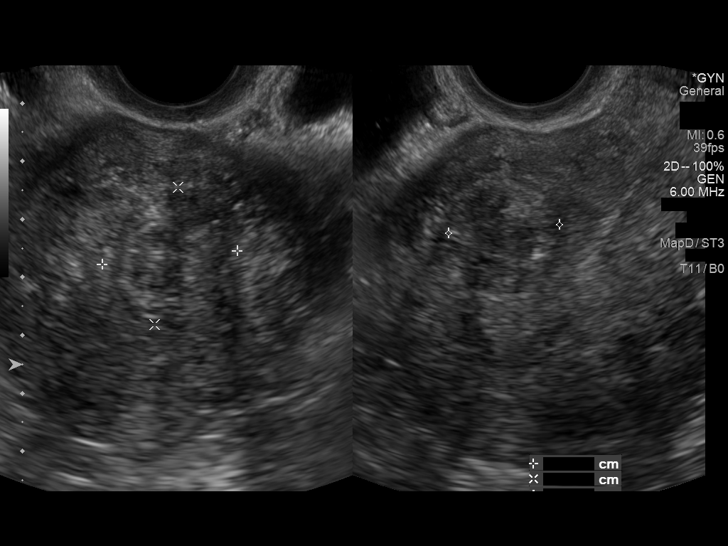
[im 37/64]
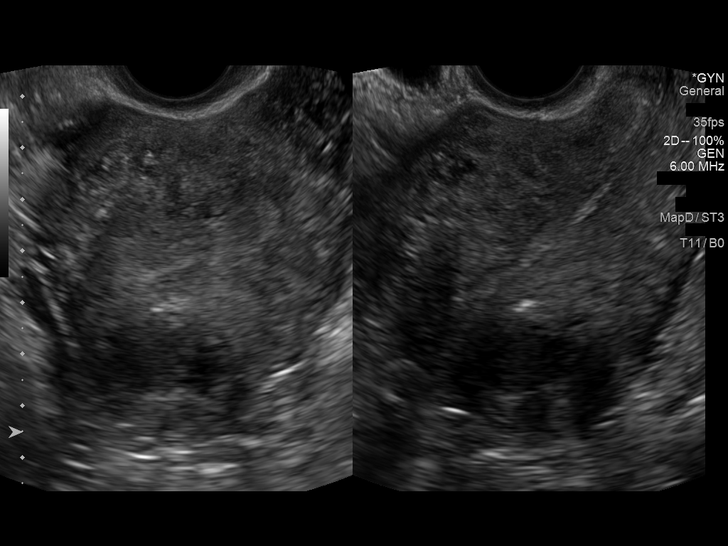
[im 43/64]
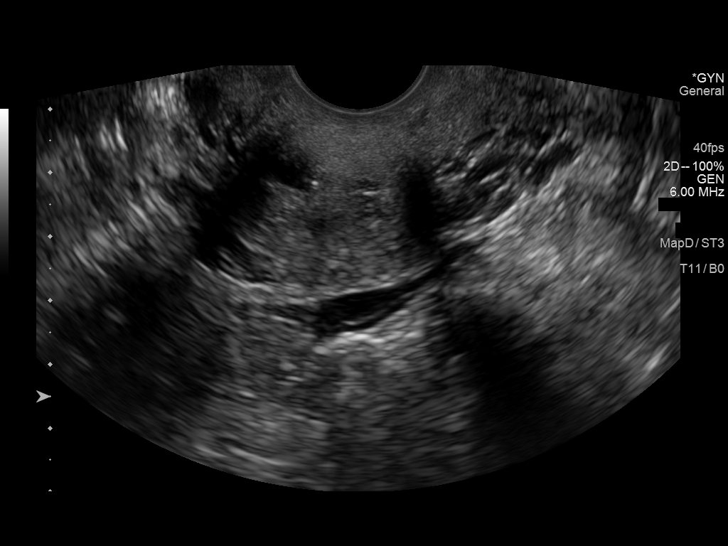
[im 48/64]
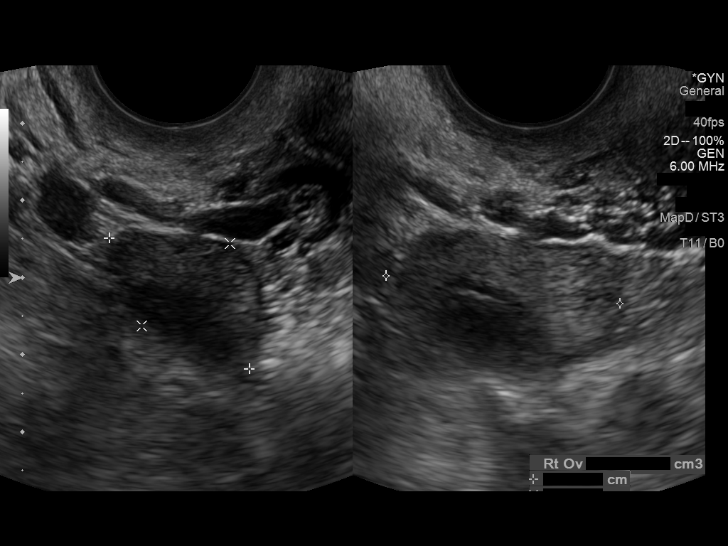
[im 53/64]
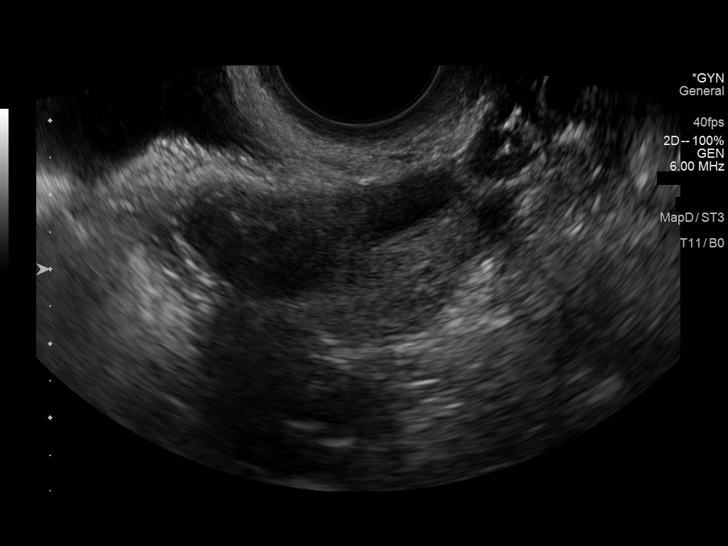
[im 58/64]
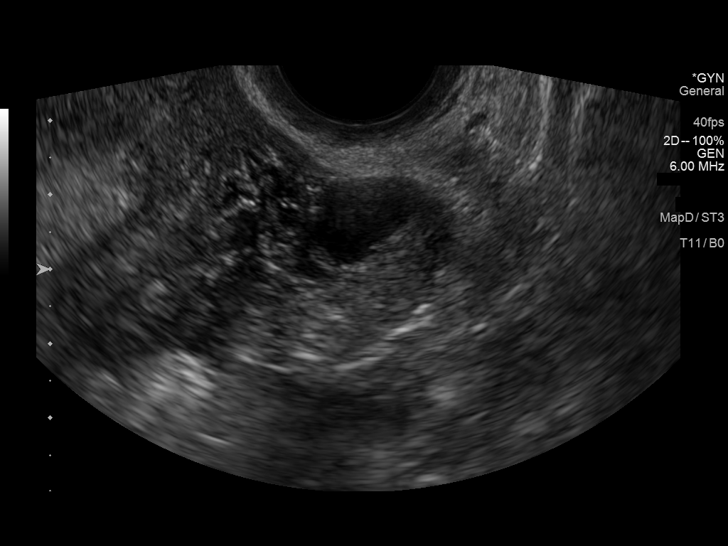
[im 64/64]
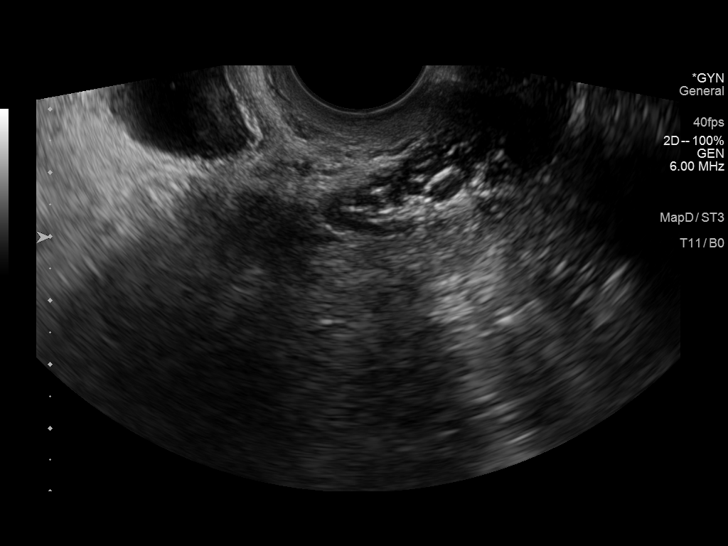

[13 of 25 positions shown; findings below may reference images not displayed]

FINDINGS: Uterus

Measurements: 11.7 x 5.2 x 6.0 cm = volume: 190 mL. Leiomyomata in
the uterus, near the fundus along the upper margin of the
endometrium a 2.3 x 2.4 x 2.0 cm leiomyoma. Another in the anterior
mid uterus to 1.6 x 2.0 x 2.1 cm.

Another in the posterior uterine fundus 3.4 x 2.7 x 3.3 cm. Mildly
heterogeneous myometrial appearance with some areas suggesting
adenomyosis and any shin blind though with leiomyomata this makes
assessment somewhat difficult.

Cervix unremarkable aside from small nabothian cyst.

Endometrium

Thickness: 5.3 mm. No focal abnormality of the endometrium, though
suspected submucosal leiomyoma near the uterine fundus.

Right ovary

Measurements: 2.6 x 1.6 x 3.1 cm = volume: 6.2 mL. Normal
appearance/no adnexal mass.

Left ovary

Measurements: 2.6 x 1.6 x 2.8 cm = volume: 6.3 mL. Normal
appearance/no adnexal mass.

Other findings

Trace free fluid
IMPRESSION: Signs of leiomyomata at throughout the uterus 1 in the fundus
potentially submucosal. Endometrium at 5.3 mm, endometrium slightly
less distinct near the uterine fundus at the site of suspected
submucosal leiomyoma. If bleeding remains unresponsive to hormonal
or medical therapy, sonohysterogram should be considered for focal
lesion work-up. Additionally, given that the endometrium is
partially obscured near the fundus gyn consultation and MRI could
also be considered. (Ref: Radiological Reasoning: Algorithmic Workup
of Abnormal Vaginal Bleeding with Endovaginal Sonography and
Sonohysterography. AJR [id number]; 191:S68-73)

Question of background adenomyosis, this could also be further
evaluated with MRI.

## 2021-05-16 ENCOUNTER — Other Ambulatory Visit (HOSPITAL_COMMUNITY): Payer: Self-pay

## 2021-05-24 ENCOUNTER — Other Ambulatory Visit (HOSPITAL_COMMUNITY): Payer: Self-pay

## 2021-05-26 ENCOUNTER — Other Ambulatory Visit: Payer: Self-pay | Admitting: Primary Care

## 2021-05-26 DIAGNOSIS — L309 Dermatitis, unspecified: Secondary | ICD-10-CM

## 2021-06-28 ENCOUNTER — Other Ambulatory Visit (HOSPITAL_COMMUNITY): Payer: Self-pay

## 2021-07-06 ENCOUNTER — Other Ambulatory Visit: Payer: Self-pay

## 2021-07-06 DIAGNOSIS — Z9889 Other specified postprocedural states: Secondary | ICD-10-CM

## 2021-07-06 DIAGNOSIS — L309 Dermatitis, unspecified: Secondary | ICD-10-CM

## 2021-07-06 DIAGNOSIS — R1900 Intra-abdominal and pelvic swelling, mass and lump, unspecified site: Secondary | ICD-10-CM

## 2021-07-07 MED ORDER — CLOBETASOL PROPIONATE 0.05 % EX CREA: TOPICAL_CREAM | Freq: Two times a day (BID) | CUTANEOUS | 1 refills | Status: DC | PRN

## 2021-08-04 ENCOUNTER — Ambulatory Visit (INDEPENDENT_AMBULATORY_CARE_PROVIDER_SITE_OTHER): Payer: Self-pay | Admitting: Plastic Surgery

## 2021-08-04 ENCOUNTER — Encounter: Payer: Self-pay | Admitting: Plastic Surgery

## 2021-08-04 VITALS — BP 126/84 | HR 102 | Ht 64.0 in | Wt 164.4 lb

## 2021-08-04 DIAGNOSIS — Z411 Encounter for cosmetic surgery: Secondary | ICD-10-CM

## 2021-08-04 NOTE — Progress Notes (Signed)
? ?Referring Provider ?Pleas Koch, NP ?[address] ?[place],  [place] [address]  ? ?CC:  ?Chief Complaint  ?Patient presents with  ? Consult  ?   ? ?Faith Roth is an 51 y.o. female.  ?HPI: Patient presents to discuss her abdominal and back contour.  She had abdominoplasty just about a year ago.  She feels somewhat dissatisfied with the result and feels like her abdomen looks fuller than she would like.  She would also like the upper and lower back fat excess addressed.  She was concerned about a recurrence of diastases after plication during the initial abdominoplasty.  Initial abdominoplasty was done in [place]. ? ?Allergies  ?Allergen Reactions  ? Propofol Other (See Comments)  ?  Wouldn't wake up from anesthesia ?Pt states, "couldn't be awaken from surgery." ?  ? ? ?Outpatient Encounter Medications as of 08/04/2021  ?Medication Sig  ? clobetasol cream (TEMOVATE) 0.05 % Apply topically 2 (two) times daily as needed.  ? Iron, Ferrous Sulfate, 325 (65 Fe) MG TABS Take 325 mg by mouth daily. For iron.  ? rizatriptan (MAXALT) 5 MG tablet Take 1 tablet by mouth at migraine onset. May repeat in 2 hours if migraine persists.  ? valACYclovir (VALTREX) 1000 MG tablet Take 2 tablets by mouth twice daily for one day as needed for cold sore outbreak.  ? [DISCONTINUED] cyclobenzaprine (FLEXERIL) 10 MG tablet Take 1 tablet (10 mg total) by mouth 3 (three) times daily as needed for muscle spasms.  ? [DISCONTINUED] gabapentin (NEURONTIN) 300 MG capsule Take 1 capsule by mouth up to three times daily as needed for nerve pain.  ? [DISCONTINUED] ibuprofen (ADVIL,MOTRIN) 800 MG tablet Take 1 tablet (800 mg total) by mouth 3 (three) times daily.  ? ?No facility-administered encounter medications on file as of 08/04/2021.  ?  ? ?Review of Systems ?General: Denies fevers and chills ? ?Physical Exam ? ?  08/04/2021  ?  1:28 PM 03/11/2021  ?  1:34 PM 01/07/2021  ?  2:50 PM  ?Vitals with BMI  ?Height '5\' 4"'$  5' 4.5" 5' 4.5"   ?Weight 164 lbs 7 oz 163 lbs 166 lbs  ?BMI 28.21 27.56 28.06  ?Systolic 497 026 378  ?Diastolic 84 74 78  ?Pulse 102 96 88  ?  ?General:  No acute distress,  Alert and oriented, Non-Toxic, Normal speech and affect ?Abdomen is soft nontender.  No obvious hernias.  I do not appreciate any obvious rectus diastases recurrence.  Well-healed scars around the umbilicus and inferiorly.  She does have mild excess adipose tissue with very little skin laxity anteriorly.  She has mild to moderate excess adipose tissue in the upper back and flanks posteriorly. ? ?Assessment/Plan ?Patient presents to discuss cosmetic concerns regarding her abdomen and back.  I think at a year she is pretty close to her final result from the abdominoplasty.  She was hoping for some more improvement but I am not sure that she will notice much more change with time.  She asked about revision of the plication in my opinion that its not worth it and I do not expect there to be much change and it would require the abdominoplasty flap to be entirely lifted up again and to me the risk benefit does not add up for that.  I do think she would be reasonable candidate for liposuction of the abdomen and back.  We discussed the risks of this procedure that include bleeding, infection, damage to surrounding structures  and need for additional procedures.  We discussed the location and orientation of the scars and health scars on the back can get thick and have a darker color.  All of her questions were answered and she is interested in moving forward. ? ?Cindra Presume ?08/04/2021, 2:03 PM  ? ? ?   ?

## 2021-08-12 ENCOUNTER — Encounter: Payer: Self-pay | Admitting: Primary Care

## 2021-08-12 ENCOUNTER — Other Ambulatory Visit: Payer: Self-pay

## 2021-08-12 ENCOUNTER — Ambulatory Visit: Payer: 59 | Admitting: Primary Care

## 2021-08-12 ENCOUNTER — Other Ambulatory Visit (HOSPITAL_COMMUNITY): Payer: Self-pay

## 2021-08-12 VITALS — BP 130/82 | HR 61 | Temp 98.6°F | Ht 64.0 in | Wt 166.0 lb

## 2021-08-12 DIAGNOSIS — D509 Iron deficiency anemia, unspecified: Secondary | ICD-10-CM | POA: Diagnosis not present

## 2021-08-12 DIAGNOSIS — R7303 Prediabetes: Secondary | ICD-10-CM

## 2021-08-12 DIAGNOSIS — N939 Abnormal uterine and vaginal bleeding, unspecified: Secondary | ICD-10-CM | POA: Diagnosis not present

## 2021-08-12 DIAGNOSIS — L309 Dermatitis, unspecified: Secondary | ICD-10-CM

## 2021-08-12 DIAGNOSIS — Z8619 Personal history of other infectious and parasitic diseases: Secondary | ICD-10-CM | POA: Diagnosis not present

## 2021-08-12 LAB — IBC + FERRITIN
Ferritin: 8 ng/mL — ABNORMAL LOW (ref 10.0–291.0)
Iron: 48 ug/dL (ref 42–145)
Saturation Ratios: 11 % — ABNORMAL LOW (ref 20.0–50.0)
TIBC: 438.2 ug/dL (ref 250.0–450.0)
Transferrin: 313 mg/dL (ref 212.0–360.0)

## 2021-08-12 LAB — CBC
HCT: 32.3 % — ABNORMAL LOW (ref 36.0–46.0)
Hemoglobin: 10.5 g/dL — ABNORMAL LOW (ref 12.0–15.0)
MCHC: 32.7 g/dL (ref 30.0–36.0)
MCV: 81.8 fl (ref 78.0–100.0)
Platelets: 370 10*3/uL (ref 150.0–400.0)
RBC: 3.95 Mil/uL (ref 3.87–5.11)
RDW: 15.1 % (ref 11.5–15.5)
WBC: 5.9 10*3/uL (ref 4.0–10.5)

## 2021-08-12 LAB — HEMOGLOBIN A1C: Hgb A1c MFr Bld: 6.2 % (ref 4.6–6.5)

## 2021-08-12 MED ORDER — VALACYCLOVIR HCL 1 G PO TABS: 2000.0000 mg | ORAL_TABLET | Freq: Two times a day (BID) | ORAL | 0 refills | Status: DC

## 2021-08-12 MED ORDER — VALACYCLOVIR HCL 1 G PO TABS
2000.0000 mg | ORAL_TABLET | Freq: Two times a day (BID) | ORAL | 0 refills | Status: DC
  Filled 2021-08-12 – 2021-09-06 (×2): qty 12, 3d supply, fill #0

## 2021-08-12 MED ORDER — TRIAMCINOLONE ACETONIDE 0.1 % EX CREA: 1.0000 "application " | TOPICAL_CREAM | Freq: Two times a day (BID) | CUTANEOUS | 1 refills | Status: DC | PRN

## 2021-08-12 MED ORDER — CLOBETASOL PROPIONATE 0.05 % EX CREA: TOPICAL_CREAM | Freq: Two times a day (BID) | CUTANEOUS | 1 refills | Status: DC | PRN

## 2021-08-12 NOTE — Assessment & Plan Note (Signed)
Chronic and continued. Strongly advised she follow up with GYN.  Repeat iron studies and CBC pending. Reviewed pelvic/transvaginal US

## 2021-08-12 NOTE — Assessment & Plan Note (Signed)
Repeat A1C pending. 

## 2021-08-12 NOTE — Progress Notes (Signed)
Subjective:    Patient ID: Faith Roth, female    DOB: 13-Aug-1970, 51 y.o.   MRN: [id number]  HPI  Faith Roth is a very pleasant 51 y.o. female with a history of abdominoplasty in 2022, prediabetes, iron deficiency anemia, abnormal vaginal bleeding, migraine who presents today for follow up. She is needing refills of Valtrex and Clobetasol.   1) History of abdominoplasty: Evaluated by plastic surgery in [place] last week for dissatisfaction of results from abdominoplasty last year completed in [place].  She also requested liposuction for upper and lower back fat excess, revision of the plication.  During this visit it was determined that revision of the plication was not an option as it would require the abdominal plasty flap to be entirely lifted again which presents a lot of risk.  It was agreed upon to proceed with liposuction of the back.  She is thinking about returning to [facility] for further evaluation of her dissatisfaction.   2) Iron Deficiency Anemia: Chronic, history of abnormal vaginal bleeding. She continues to experience abnormal uterine bleeding, last month was severe. She's undergone pelvic/transvaginal ultrasound in April 2022 which revealed leiomyomata. She's been advised to follow up with GYN and has yet to do so. She does plan on doing this soon. She is taking oral iron mostly everyday now, resumed this about two weeks ago.   She denies palpitations, chest pain, shortness of breath. She has noticed lightheadedness with position changes at times.   3) Prediabetes: A1C of 6.3 in December 2022. Due for repeat A1C today. She's been trying to work on her diet.   4) History of Cold Sores: Currently managed on Valtrex 2000 mg BID x 1 day as needed. Overall infrequent outbreaks. She is needing refills today.   BP Readings from Last 3 Encounters:  08/12/21 130/82  08/04/21 126/84  03/11/21 132/74   5) Eczema: Chronic. Managed on clobetasol 0.05% cream for which she  uses on her extremities. She has recently had outbreaks to her feet. She's noticed darkening skin to her feet since using the high potency clobetasol and is requesting a lower potency cream. She's also noticed eczema to her eye lids.    Review of Systems  Respiratory:  Negative for shortness of breath.   Cardiovascular:  Negative for chest pain and palpitations.  Skin:  Positive for rash.  Neurological:  Positive for dizziness and light-headedness.  Psychiatric/Behavioral:  The patient is nervous/anxious.         Past Medical History:  Diagnosis Date   Allergy    Chicken pox    Depression    Migraines    UTI (urinary tract infection)     Social History   Socioeconomic History   Marital status: Single    Spouse name: Not on file   Number of children: Not on file   Years of education: Not on file   Highest education level: Not on file  Occupational History   Not on file  Tobacco Use   Smoking status: Never   Smokeless tobacco: Never  Substance and Sexual Activity   Alcohol use: No   Drug use: No   Sexual activity: Yes    Birth control/protection: Pill  Other Topics Concern   Not on file  Social History Narrative   Not on file   Social Determinants of Health   Financial Resource Strain: Not on file  Food Insecurity: Not on file  Transportation Needs: Not on file  Physical Activity: Not on file  Stress: Not on file  Social Connections: Not on file  Intimate Partner Violence: Not on file    Past Surgical History:  Procedure Laterality Date   CESAREAN SECTION  12/04/2004    Family History  Problem Relation Age of Onset   Hypertension Mother    Diabetes Mother    Heart attack Mother    Hyperlipidemia Mother    Kidney disease Mother    Thyroid disease Mother    Hypertension Father    Diabetes Father    Arthritis Father    Diabetes Sister     Allergies  Allergen Reactions   Propofol Other (See Comments)    Wouldn't wake up from anesthesia Pt  states, "couldn't be awaken from surgery."     Current Outpatient Medications on File Prior to Visit  Medication Sig Dispense Refill   clobetasol cream (TEMOVATE) 0.05 % Apply topically 2 (two) times daily as needed. 30 g 1   Iron, Ferrous Sulfate, 325 (65 Fe) MG TABS Take 325 mg by mouth daily. For iron. 90 tablet 1   rizatriptan (MAXALT) 5 MG tablet Take 1 tablet by mouth at migraine onset. May repeat in 2 hours if migraine persists. 10 tablet 0   valACYclovir (VALTREX) 1000 MG tablet Take 2 tablets by mouth twice daily for one day as needed for cold sore outbreak. 12 tablet 0   No current facility-administered medications on file prior to visit.    BP 130/82   Pulse 61   Temp 98.6 F (37 C) (Temporal)   Ht '5\' 4"'$  (1.626 m)   Wt 166 lb (75.3 kg)   SpO2 97%   BMI 28.49 kg/m  Objective:   Physical Exam Cardiovascular:     Rate and Rhythm: Normal rate and regular rhythm.  Pulmonary:     Effort: Pulmonary effort is normal.     Breath sounds: Normal breath sounds.  Musculoskeletal:     Cervical back: Neck supple.  Skin:    General: Skin is warm and dry.     Findings: Rash present.     Comments: Hyperpigmented skin with rash to toes, dorsal feet  Psychiatric:        Mood and Affect: Mood normal.          Assessment & Plan:      This visit occurred during the SARS-CoV-2 public health emergency.  Safety protocols were in place, including screening questions prior to the visit, additional usage of staff PPE, and extensive cleaning of exam room while observing appropriate contact time as indicated for disinfecting solutions.

## 2021-08-12 NOTE — Assessment & Plan Note (Signed)
Repeat iron studies and CBC pending. Strongly advised patient follow up with GYN.

## 2021-08-12 NOTE — Patient Instructions (Signed)
Stop by the lab prior to leaving today. I will notify you of your results once received.   Refrain from overuse of topical steroids, especially to your face.  Please schedule a visit with your OB/GYN.  It was a pleasure to see you today!

## 2021-08-12 NOTE — Assessment & Plan Note (Signed)
Overall stable.  Continue Valtrex 2000 mg BID x 1 day PRN

## 2021-08-12 NOTE — Assessment & Plan Note (Signed)
Refills provided for clobetasol 0.05% cream to use on upper extremities. Rx for triamcinolone 0.01% cream provided to use on eyelids and toes.  Overuse of topical steroid precautions provided.

## 2021-08-18 DIAGNOSIS — D509 Iron deficiency anemia, unspecified: Secondary | ICD-10-CM

## 2021-08-19 ENCOUNTER — Telehealth: Payer: Self-pay | Admitting: Adult Health

## 2021-08-19 NOTE — Telephone Encounter (Signed)
Scheduled appt per 5/25 referral. Pt is aware of appt date and time. Pt is aware to arrive 15 mins prior to appt time and to bring and updated insurance card. Pt is aware of appt location.

## 2021-08-23 ENCOUNTER — Other Ambulatory Visit (HOSPITAL_COMMUNITY): Payer: Self-pay

## 2021-08-26 ENCOUNTER — Encounter: Payer: Self-pay | Admitting: Adult Health

## 2021-08-26 ENCOUNTER — Inpatient Hospital Stay: Payer: 59 | Attending: Adult Health | Admitting: Adult Health

## 2021-08-26 ENCOUNTER — Other Ambulatory Visit: Payer: Self-pay

## 2021-08-26 DIAGNOSIS — N920 Excessive and frequent menstruation with regular cycle: Secondary | ICD-10-CM | POA: Diagnosis not present

## 2021-08-26 DIAGNOSIS — D509 Iron deficiency anemia, unspecified: Secondary | ICD-10-CM | POA: Diagnosis not present

## 2021-08-26 MED ORDER — IRON (FERROUS SULFATE) 325 (65 FE) MG PO TABS: 325.0000 mg | ORAL_TABLET | Freq: Every day | ORAL | 1 refills | Status: DC

## 2021-08-26 NOTE — Progress Notes (Signed)
[place]  [phone] [phone]     ID: Faith Roth DOB: 1970/11/09  MR#: [id number]  QMG#:[id number]  Patient Care Team: Pleas Koch, NP as PCP - General (Internal Medicine) Scot Dock, NP OTHER MD:  CHIEF COMPLAINT: iron deficiency anemia  CURRENT TREATMENT: oral iron   HISTORY OF PRESENT ILLNESS: Faith Roth is here today for follow up of her anemia.  She has heavy menstrual cycles that have increased over the past 4 months.  She uses Ultra tampons and pads that last about an hour.  This has occurred for the past 3 months and her cycles last about 5 days and occur every 28 days, however she recently skipped a cycle.  She has not yet seen gynecology.    She denies blood in her stool/black/tarry stool.  Her last colonoscopy was 5 years ago and is due this year.   She is increasingly fatigued and is chewing ice.  She has taken oral iron, however it can sometimes cause gi upset and she remains very tired.     REVIEW OF SYSTEMS: Review of Systems  Constitutional:  Positive for fatigue. Negative for appetite change, chills, fever and unexpected weight change.  HENT:   Negative for hearing loss, lump/mass and trouble swallowing.   Eyes:  Negative for eye problems and icterus.  Respiratory:  Negative for chest tightness, cough and shortness of breath.   Cardiovascular:  Negative for chest pain, leg swelling and palpitations.  Gastrointestinal:  Negative for abdominal distention, abdominal pain, constipation, diarrhea, nausea and vomiting.  Endocrine: Negative for hot flashes.  Genitourinary:  Negative for difficulty urinating.   Musculoskeletal:  Negative for arthralgias.  Skin:  Negative for itching and rash.  Neurological:  Negative for dizziness, extremity weakness, headaches and numbness.  Hematological:  Negative for adenopathy. Does not bruise/bleed easily.  Psychiatric/Behavioral:  Negative for depression. The patient is  not nervous/anxious.      PAST MEDICAL HISTORY: Past Medical History:  Diagnosis Date   Allergy    Chicken pox    Depression    Migraines    UTI (urinary tract infection)     PAST SURGICAL HISTORY: Past Surgical History:  Procedure Laterality Date   CESAREAN SECTION  12/04/2004    FAMILY HISTORY Family History  Problem Relation Age of Onset   Hypertension Mother    Diabetes Mother    Heart attack Mother    Hyperlipidemia Mother    Kidney disease Mother    Thyroid disease Mother    Hypertension Father    Diabetes Father    Arthritis Father    Diabetes Sister     GYNECOLOGIC HISTORY:  Menarche: 51 years old Age at first live birth: 51 years old [place] P 2 LMP 07/09/2021 Contraceptive, depo provera several years ago, OCP HRT none  Hysterectomy? none Salpingo-oophorectomy?none    SOCIAL HISTORY:  Faith Roth lives at home with her significant other and her youngest son, Faith Roth who is 65 years old.  Her oldest son lives in [place] with his girlfriend and her youngest grandson.  She has another grandson who lives in [place]. She works as a Technical brewer at [employer].  She enjoys going to the movies, swimming, and spending time with her friends.     ADVANCED DIRECTIVES: Her oldest son Faith Roth is her next of kin and the person she would like to make decisions on her behalf should she be unable to do so. [phone]   HEALTH  MAINTENANCE: Social History   Tobacco Use   Smoking status: Never   Smokeless tobacco: Never  Substance Use Topics   Alcohol use: No   Drug use: No      Allergies  Allergen Reactions   Propofol Other (See Comments)    Wouldn't wake up from anesthesia Pt states, "couldn't be awaken from surgery."     Current Outpatient Medications  Medication Sig Dispense Refill   clobetasol cream (TEMOVATE) 0.05 % Apply topically 2 (two) times daily as needed. 30 g 1   Iron, Ferrous Sulfate, 325 (65 Fe) MG TABS Take 325 mg by mouth  daily. For iron. 90 tablet 1   rizatriptan (MAXALT) 5 MG tablet Take 1 tablet by mouth at migraine onset. May repeat in 2 hours if migraine persists. 10 tablet 0   triamcinolone cream (KENALOG) 0.1 % Apply 1 application. topically 2 (two) times daily as needed. 30 g 1   valACYclovir (VALTREX) 1000 MG tablet Take 2 tablets by mouth twice daily for one day as needed for cold sore outbreak. 12 tablet 0   No current facility-administered medications for this visit.    OBJECTIVE:  Vitals:   08/26/21 1409  BP: 116/73  Pulse: 88  Resp: 18  Temp: (!) 97.5 F (36.4 C)  SpO2: 98%     Body mass index is 28.75 kg/m.   Wt Readings from Last 3 Encounters:  08/26/21 167 lb 8 oz (76 kg)  08/12/21 166 lb (75.3 kg)  08/04/21 164 lb 7.2 oz (74.6 kg)      ECOG FS:1 - Symptomatic but completely ambulatory GENERAL: Patient is a well appearing female in no acute distress HEENT:  Sclerae anicteric.  Oropharynx clear and moist. No ulcerations or evidence of oropharyngeal candidiasis. Neck is supple.  NODES:  No cervical, supraclavicular, or axillary lymphadenopathy palpated.  BREAST EXAM:  Deferred. LUNGS:  Clear to auscultation bilaterally.  No wheezes or rhonchi. HEART:  Regular rate and rhythm. No murmur appreciated. ABDOMEN:  Soft, nontender.  Positive, normoactive bowel sounds. No organomegaly palpated. MSK:  No focal spinal tenderness to palpation. Full range of motion bilaterally in the upper extremities. EXTREMITIES:  No peripheral edema.   SKIN:  Clear with no obvious rashes or skin changes. No nail dyscrasia. NEURO:  Nonfocal. Well oriented.  Appropriate affect.   LAB RESULTS:  Lab Results  Component Value Date   WBC 5.9 08/12/2021   NEUTROABS 3.0 10/29/2020   HGB 10.5 (L) 08/12/2021   HCT 32.3 (L) 08/12/2021   MCV 81.8 08/12/2021   PLT 370.0 08/12/2021      Chemistry      Component Value Date/Time   NA 138 08/11/2020 1535   K 4.0 08/11/2020 1535   CL 102 08/11/2020 1535    CO2 29 08/11/2020 1535   BUN 14 08/11/2020 1535   CREATININE 0.73 08/11/2020 1535      Component Value Date/Time   CALCIUM 9.6 08/11/2020 1535   ALKPHOS 76 08/11/2020 1535   AST 14 08/11/2020 1535   ALT 12 08/11/2020 1535   BILITOT 0.2 08/11/2020 1535        Lab Results  Component Value Date   IRON 48 08/12/2021   TIBC 438.2 08/12/2021   IRONPCTSAT 11.0 (L) 08/12/2021    Lab Results  Component Value Date   FERRITIN 8.0 (L) 08/12/2021    ASSESSMENT: 51 y.o. woman with iron deficiency anemia.  Iron deficiency anemia with ferritin of 8 and saturation of 11% Likely secondary to menorrhagia,  gynecology evaluation pending IV iron with Venofer x 3  PLAN:  [facility] is here for follow-up and evaluation of iron deficiency anemia.  We reviewed that all blood cells are made from bone marrow.  There are 3 main types of blood cells which include white blood cells, red blood cells, and platelets. 1.  White blood cells: These are immune cells.  Faith Roth are normal in number and size. 2.  Platelets: These are clotting cells.  Faith Roth are normal in number and size. 3.  Red blood cells: These are oxygen-carrying cells.  We measured this by looking at the hemoglobin.  Faith Roth's red blood cells are decreased in number and are smaller in size.    We discussed that because Faith Roth red blood cells are decreased and smaller in size that led Faith Roth to check a ferritin and iron panel.  This was indicative of iron deficiency with a ferritin that was 8.  This is the cause of her tiredness and ice chewing.    I reviewed with Faith Roth that the only way to lose iron is to bleed.  Her most recent colonoscopy was 5 years ago and she is due for repeat this year.  She has had heavy cycles admit as noted in her history of present illness.  Because of the menorrhagia I recommended follow-up and evaluation with gynecology.  She is working on getting this scheduled.  Once the underlying cause of blood loss is  determined and stopped, she will stop losing iron.  She is struggling with persistent fatigue and is taking iron and has some GI discomfort with this.  We discussed that receiving IV iron x3 would help improve her level of fatigue faster.  After discussion of the above plan Faith Roth is in agreement with it.  She will receive IV iron weekly x3 and we will subsequently evaluate her a few weeks later.  I spent approximately 30 minutes face to face with Faith Roth with more than 50% of that time spent in counseling and coordination of care. Specifically we reviewed the biology of the patient's diagnosis and the specifics of her situation.      Faith Roth has a good understanding of the overall plan. She agrees with it. She will call with any problems that may develop before her next visit here.  Wilber Bihari, NP 09/03/21 8:26 AM Medical Oncology and Hematology [facility] [place], [place] [address] Tel. [phone]    Fax. [phone]

## 2021-09-01 ENCOUNTER — Inpatient Hospital Stay: Payer: 59

## 2021-09-01 ENCOUNTER — Other Ambulatory Visit: Payer: Self-pay

## 2021-09-01 VITALS — BP 136/91 | HR 79 | Temp 98.1°F | Resp 17

## 2021-09-01 DIAGNOSIS — D509 Iron deficiency anemia, unspecified: Secondary | ICD-10-CM

## 2021-09-01 DIAGNOSIS — N920 Excessive and frequent menstruation with regular cycle: Secondary | ICD-10-CM | POA: Diagnosis not present

## 2021-09-01 MED ORDER — SODIUM CHLORIDE 0.9 % IV SOLN
300.0000 mg | Freq: Once | INTRAVENOUS | Status: AC
  Administered 2021-09-01: 300 mg via INTRAVENOUS
  Filled 2021-09-01: qty 300

## 2021-09-01 MED ORDER — SODIUM CHLORIDE 0.9 % IV SOLN: Freq: Once | INTRAVENOUS | Status: AC

## 2021-09-03 ENCOUNTER — Encounter: Payer: Self-pay | Admitting: Adult Health

## 2021-09-04 ENCOUNTER — Other Ambulatory Visit: Payer: Self-pay | Admitting: Primary Care

## 2021-09-04 DIAGNOSIS — D509 Iron deficiency anemia, unspecified: Secondary | ICD-10-CM

## 2021-09-06 ENCOUNTER — Other Ambulatory Visit (HOSPITAL_COMMUNITY): Payer: Self-pay

## 2021-09-08 ENCOUNTER — Other Ambulatory Visit (INDEPENDENT_AMBULATORY_CARE_PROVIDER_SITE_OTHER): Payer: 59

## 2021-09-08 ENCOUNTER — Telehealth: Payer: Self-pay

## 2021-09-08 DIAGNOSIS — D509 Iron deficiency anemia, unspecified: Secondary | ICD-10-CM | POA: Diagnosis not present

## 2021-09-08 NOTE — Telephone Encounter (Signed)
-----   Message from Gardenia Phlegm, NP sent at 09/04/2021 12:08 PM EDT ----- Will you please notify the patient of the below message from Anda Kraft? ----- Message ----- From: Pleas Koch, NP Sent: 09/04/2021   5:05 AM EDT To: Gardenia Phlegm, NP  Thank you for seeing her! Yes, just notify her that she needs to set up a lab appointment to have labs redrawn at 3 and 6 months. I will place orders.  ----- Message ----- From: Gardenia Phlegm, NP Sent: 09/03/2021   8:28 AM EDT To: Pleas Koch, NP  Hey there.  Thanks for sending me this patient.  Can you order and schedule get CBC with diff, iron panel, ferritin, and b12 in 3 months?  Repeat CBC, iron panel/ferritin in 6 months.  I am seeing her back in 6 months so long as everything is going well.  Thanks :)

## 2021-09-08 NOTE — Telephone Encounter (Signed)
Spoke with pt regarding lab work every 3 months per PCP.  Informed pt to call and make appts at PCP office before her visit at [facility] with NP.  Pt verbalized understanding.

## 2021-09-09 ENCOUNTER — Inpatient Hospital Stay: Payer: 59

## 2021-09-09 ENCOUNTER — Other Ambulatory Visit: Payer: Self-pay

## 2021-09-09 VITALS — BP 129/88 | HR 80 | Temp 97.7°F | Resp 17

## 2021-09-09 DIAGNOSIS — D509 Iron deficiency anemia, unspecified: Secondary | ICD-10-CM | POA: Diagnosis not present

## 2021-09-09 DIAGNOSIS — N920 Excessive and frequent menstruation with regular cycle: Secondary | ICD-10-CM | POA: Diagnosis not present

## 2021-09-09 LAB — CBC WITH DIFFERENTIAL/PLATELET
Basophils Absolute: 0 10*3/uL (ref 0.0–0.1)
Basophils Relative: 0.7 % (ref 0.0–3.0)
Eosinophils Absolute: 0.2 10*3/uL (ref 0.0–0.7)
Eosinophils Relative: 2.1 % (ref 0.0–5.0)
HCT: 34.7 % — ABNORMAL LOW (ref 36.0–46.0)
Hemoglobin: 11.3 g/dL — ABNORMAL LOW (ref 12.0–15.0)
Lymphocytes Relative: 23.1 % (ref 12.0–46.0)
Lymphs Abs: 1.7 10*3/uL (ref 0.7–4.0)
MCHC: 32.5 g/dL (ref 30.0–36.0)
MCV: 86.5 fl (ref 78.0–100.0)
Monocytes Absolute: 0.6 10*3/uL (ref 0.1–1.0)
Monocytes Relative: 8.8 % (ref 3.0–12.0)
Neutro Abs: 4.8 10*3/uL (ref 1.4–7.7)
Neutrophils Relative %: 65.3 % (ref 43.0–77.0)
Platelets: 417 10*3/uL — ABNORMAL HIGH (ref 150.0–400.0)
RBC: 4.02 Mil/uL (ref 3.87–5.11)
RDW: 17.5 % — ABNORMAL HIGH (ref 11.5–15.5)
WBC: 7.3 10*3/uL (ref 4.0–10.5)

## 2021-09-09 LAB — IBC + FERRITIN
Ferritin: 94.3 ng/mL (ref 10.0–291.0)
Iron: 49 ug/dL (ref 42–145)
Saturation Ratios: 11.4 % — ABNORMAL LOW (ref 20.0–50.0)
TIBC: 429.8 ug/dL (ref 250.0–450.0)
Transferrin: 307 mg/dL (ref 212.0–360.0)

## 2021-09-09 LAB — VITAMIN B12: Vitamin B-12: 106 pg/mL — ABNORMAL LOW (ref 211–911)

## 2021-09-09 MED ORDER — SODIUM CHLORIDE 0.9 % IV SOLN
300.0000 mg | Freq: Once | INTRAVENOUS | Status: AC
  Administered 2021-09-09: 300 mg via INTRAVENOUS
  Filled 2021-09-09: qty 300

## 2021-09-09 MED ORDER — SODIUM CHLORIDE 0.9 % IV SOLN: Freq: Once | INTRAVENOUS | Status: AC

## 2021-09-09 NOTE — Patient Instructions (Signed)

## 2021-09-16 ENCOUNTER — Other Ambulatory Visit: Payer: Self-pay

## 2021-09-16 ENCOUNTER — Inpatient Hospital Stay: Payer: 59

## 2021-09-16 VITALS — BP 131/77 | HR 80 | Temp 98.5°F | Resp 16

## 2021-09-16 DIAGNOSIS — N920 Excessive and frequent menstruation with regular cycle: Secondary | ICD-10-CM | POA: Diagnosis not present

## 2021-09-16 DIAGNOSIS — D509 Iron deficiency anemia, unspecified: Secondary | ICD-10-CM

## 2021-09-16 MED ORDER — SODIUM CHLORIDE 0.9 % IV SOLN
300.0000 mg | Freq: Once | INTRAVENOUS | Status: AC
  Administered 2021-09-16: 300 mg via INTRAVENOUS
  Filled 2021-09-16: qty 300

## 2021-09-16 MED ORDER — SODIUM CHLORIDE 0.9 % IV SOLN: Freq: Once | INTRAVENOUS | Status: AC

## 2021-09-28 ENCOUNTER — Encounter: Payer: Self-pay | Admitting: Family Medicine

## 2021-09-28 ENCOUNTER — Ambulatory Visit: Payer: 59 | Admitting: Family Medicine

## 2021-09-28 VITALS — BP 144/92 | HR 86 | Temp 98.4°F | Ht 64.0 in | Wt 169.6 lb

## 2021-09-28 DIAGNOSIS — M545 Low back pain, unspecified: Secondary | ICD-10-CM | POA: Diagnosis not present

## 2021-09-28 MED ORDER — TRAMADOL HCL 50 MG PO TABS: 50.0000 mg | ORAL_TABLET | Freq: Three times a day (TID) | ORAL | 0 refills | Status: AC | PRN

## 2021-09-28 MED ORDER — KETOROLAC TROMETHAMINE 60 MG/2ML IM SOLN
60.0000 mg | Freq: Once | INTRAMUSCULAR | Status: AC
  Administered 2021-09-28: 60 mg via INTRAMUSCULAR

## 2021-09-28 MED ORDER — CYCLOBENZAPRINE HCL 10 MG PO TABS: 10.0000 mg | ORAL_TABLET | Freq: Every evening | ORAL | 0 refills | Status: DC | PRN

## 2021-09-28 MED ORDER — DICLOFENAC SODIUM 75 MG PO TBEC: 75.0000 mg | DELAYED_RELEASE_TABLET | Freq: Two times a day (BID) | ORAL | 0 refills | Status: DC

## 2021-09-28 NOTE — Progress Notes (Signed)
Subjective  CC:  Chief Complaint  Patient presents with   Back Pain    Pt c/o back pain that has been here x1 week that's hot to the touch and keeping her up at night. Otc advil is not helping, it eases it but the pressure is worse.     HPI: Faith Roth is a 51 y.o. female who presents to the office today to address the problems listed above in the chief complaint. ***  Assessment  No diagnosis found.   Plan  ***:  ***  Follow up: No follow-ups on file.  Visit date not found  No orders of the defined types were placed in this encounter.  No orders of the defined types were placed in this encounter.     I reviewed the patients updated PMH, FH, and SocHx.    Patient Active Problem List   Diagnosis Date Noted   Iron deficiency anemia 08/26/2021   History of cold sores 01/07/2021   Abdominal wall swelling 10/29/2020   Exertional shortness of breath 10/29/2020   S/P abdominoplasty 10/01/2020   Abnormal vaginal bleeding 08/11/2020   Prediabetes 08/11/2020   Pre-op testing 08/11/2020   Chronic pain of right upper extremity 06/17/2020   Anemia 06/17/2020   Urine frequency 04/15/2020   Chronic constipation 03/05/2020   Preventative health care 03/05/2020   Family history of thyroid disease 03/05/2020   Eczema 03/05/2020   Cervical radiculopathy 02/24/2020   Chronic migraine without aura without status migrainosus, not intractable 02/24/2020   No outpatient medications have been marked as taking for the 09/28/21 encounter (Office Visit) with Leamon Arnt, MD.    Allergies: Patient is allergic to propofol. Family History: Patient family history includes Arthritis in her father; Diabetes in her father, mother, and sister; Heart attack in her mother; Hyperlipidemia in her mother; Hypertension in her father and mother; Kidney disease in her mother; Thyroid disease in her mother. Social History:  Patient  reports that she has never smoked. She has never used  smokeless tobacco. She reports that she does not drink alcohol and does not use drugs.  Review of Systems: Constitutional: Negative for fever malaise or anorexia Cardiovascular: negative for chest pain Respiratory: negative for SOB or persistent cough Gastrointestinal: negative for abdominal pain  Objective  Vitals: There were no vitals taken for this visit. General: no acute distress , A&Ox3 HEENT: PEERL, conjunctiva normal, neck is supple Cardiovascular:  RRR without murmur or gallop.  Respiratory:  Good breath sounds bilaterally, CTAB with normal respiratory effort Skin:  Warm, no rashes    Commons side effects, risks, benefits, and alternatives for medications and treatment plan prescribed today were discussed, and the patient expressed understanding of the given instructions. Patient is instructed to call or message via MyChart if he/she has any questions or concerns regarding our treatment plan. No barriers to understanding were identified. We discussed Red Flag symptoms and signs in detail. Patient expressed understanding regarding what to do in case of urgent or emergency type symptoms.  Medication list was reconciled, printed and provided to the patient in AVS. Patient instructions and summary information was reviewed with the patient as documented in the AVS. This note was prepared with assistance of Dragon voice recognition software. Occasional wrong-word or sound-a-like substitutions may have occurred due to the inherent limitations of voice recognition software  This visit occurred during the SARS-CoV-2 public health emergency.  Safety protocols were in place, including screening questions prior to the visit, additional usage of  staff PPE, and extensive cleaning of exam room while observing appropriate contact time as indicated for disinfecting solutions.

## 2021-09-28 NOTE — Patient Instructions (Signed)
Please return in 1-2 weeks for recheck if needed.  If you have any questions or concerns, please don't hesitate to send me a message via MyChart or call the office at [phone]. Thank you for visiting with [place] today! It's our pleasure caring for you.   Back Exercises The following exercises strengthen the muscles that help to support the trunk (torso) and back. They also help to keep the lower back flexible. Doing these exercises can help to prevent or lessen existing low back pain. If you have back pain or discomfort, try doing these exercises 2-3 times each day or as told by your health care provider. As your pain improves, do them once each day, but increase the number of times that you repeat the steps for each exercise (do more repetitions). To prevent the recurrence of back pain, continue to do these exercises once each day or as told by your health care provider. Do exercises exactly as told by your health care provider and adjust them as directed. It is normal to feel mild stretching, pulling, tightness, or discomfort as you do these exercises, but you should stop right away if you feel sudden pain or your pain gets worse. Exercises Single knee to chest Repeat these steps 3-5 times for each leg: Lie on your back on a firm bed or the floor with your legs extended. Bring one knee to your chest. Your other leg should stay extended and in contact with the floor. Hold your knee in place by grabbing your knee or thigh with both hands and hold. Pull on your knee until you feel a gentle stretch in your lower back or buttocks. Hold the stretch for 10-30 seconds. Slowly release and straighten your leg.  Pelvic tilt Repeat these steps 5-10 times: Lie on your back on a firm bed or the floor with your legs extended. Bend your knees so they are pointing toward the ceiling and your feet are flat on the floor. Tighten your lower abdominal muscles to press your lower back against the floor. This  motion will tilt your pelvis so your tailbone points up toward the ceiling instead of pointing to your feet or the floor. With gentle tension and even breathing, hold this position for 5-10 seconds.  Cat-cow Repeat these steps until your lower back becomes more flexible: Get into a hands-and-knees position on a firm bed or the floor. Keep your hands under your shoulders, and keep your knees under your hips. You may place padding under your knees for comfort. Let your head hang down toward your chest. Contract your abdominal muscles and point your tailbone toward the floor so your lower back becomes rounded like the back of a cat. Hold this position for 5 seconds. Slowly lift your head, let your abdominal muscles relax, and point your tailbone up toward the ceiling so your back forms a sagging arch like the back of a cow. Hold this position for 5 seconds.  Press-ups Repeat these steps 5-10 times: Lie on your abdomen (face-down) on a firm bed or the floor. Place your palms near your head, about shoulder-width apart. Keeping your back as relaxed as possible and keeping your hips on the floor, slowly straighten your arms to raise the top half of your body and lift your shoulders. Do not use your back muscles to raise your upper torso. You may adjust the placement of your hands to make yourself more comfortable. Hold this position for 5 seconds while you keep your back relaxed. Slowly  return to lying flat on the floor.  Bridges Repeat these steps 10 times: Lie on your back on a firm bed or the floor. Bend your knees so they are pointing toward the ceiling and your feet are flat on the floor. Your arms should be flat at your sides, next to your body. Tighten your buttocks muscles and lift your buttocks off the floor until your waist is at almost the same height as your knees. You should feel the muscles working in your buttocks and the back of your thighs. If you do not feel these muscles, slide  your feet 1-2 inches (2.5-5 cm) farther away from your buttocks. Hold this position for 3-5 seconds. Slowly lower your hips to the starting position, and allow your buttocks muscles to relax completely. If this exercise is too easy, try doing it with your arms crossed over your chest. Abdominal crunches Repeat these steps 5-10 times: Lie on your back on a firm bed or the floor with your legs extended. Bend your knees so they are pointing toward the ceiling and your feet are flat on the floor. Cross your arms over your chest. Tip your chin slightly toward your chest without bending your neck. Tighten your abdominal muscles and slowly raise your torso high enough to lift your shoulder blades a tiny bit off the floor. Avoid raising your torso higher than that because it can put too much stress on your lower back and does not help to strengthen your abdominal muscles. Slowly return to your starting position.  Back lifts Repeat these steps 5-10 times: Lie on your abdomen (face-down) with your arms at your sides, and rest your forehead on the floor. Tighten the muscles in your legs and your buttocks. Slowly lift your chest off the floor while you keep your hips pressed to the floor. Keep the back of your head in line with the curve in your back. Your eyes should be looking at the floor. Hold this position for 3-5 seconds. Slowly return to your starting position.  Contact a health care provider if: Your back pain or discomfort gets much worse when you do an exercise. Your worsening back pain or discomfort does not lessen within 2 hours after you exercise. If you have any of these problems, stop doing these exercises right away. Do not do them again unless your health care provider says that you can. Get help right away if: You develop sudden, severe back pain. If this happens, stop doing the exercises right away. Do not do them again unless your health care provider says that you can. This  information is not intended to replace advice given to you by your health care provider. Make sure you discuss any questions you have with your health care provider. Document Revised: 09/07/2020 Document Reviewed: 05/26/2020 Elsevier Patient Education  [employer].

## 2021-09-29 NOTE — Progress Notes (Signed)
Subjective  CC:  Chief Complaint  Patient presents with   Back Pain    Pt c/o back pain that has been here x1 week that's hot to the touch and keeping her up at night. Otc advil is not helping, it eases it but the pressure is worse.    Same day acute visit; PCP not available. New pt to me. Chart reviewed.   HPI: Faith Roth is a 51 y.o. female who presents to the office today to address the problems listed above in the chief complaint. 51 yo c/o over one week of low back pain: hurts to move, bilateral lower back w/o injury; she was working in yard the week prior thought. No b/b dysfunction. No radicular sxs. No f/c/s but feels hot. Taking tylenol with some relief. Has h/o lower back problems and herniate disc. Reports h/o epidural steroid injection.  Pain interfered with sleep last night.  Reviewed imaging records of cervical spine. Don't see lumbar MRI but pt reports has had one in the last year or two.   Assessment  1. Acute bilateral low back pain without sciatica      Plan  Low back pain:  msk: improved with toradol '60mg'$  IM.  treat with nsaids, ultram if needed and mm relaxer, education given. Once improving, start back exercises. F/u if red flag sxs develop. Defer xrays for now.   Follow up: prn  Visit date not found  No orders of the defined types were placed in this encounter.  Meds ordered this encounter  Medications   cyclobenzaprine (FLEXERIL) 10 MG tablet    Sig: Take 1 tablet (10 mg total) by mouth at bedtime as needed for muscle spasms.    Dispense:  30 tablet    Refill:  0   traMADol (ULTRAM) 50 MG tablet    Sig: Take 1 tablet (50 mg total) by mouth every 8 (eight) hours as needed for up to 5 days.    Dispense:  30 tablet    Refill:  0   diclofenac (VOLTAREN) 75 MG EC tablet    Sig: Take 1 tablet (75 mg total) by mouth 2 (two) times daily.    Dispense:  30 tablet    Refill:  0   ketorolac (TORADOL) injection 60 mg      I reviewed the patients  updated PMH, FH, and SocHx.    Patient Active Problem List   Diagnosis Date Noted   Iron deficiency anemia 08/26/2021   History of cold sores 01/07/2021   Abdominal wall swelling 10/29/2020   Exertional shortness of breath 10/29/2020   S/P abdominoplasty 10/01/2020   Abnormal vaginal bleeding 08/11/2020   Prediabetes 08/11/2020   Pre-op testing 08/11/2020   Chronic pain of right upper extremity 06/17/2020   Anemia 06/17/2020   Urine frequency 04/15/2020   Chronic constipation 03/05/2020   Preventative health care 03/05/2020   Family history of thyroid disease 03/05/2020   Eczema 03/05/2020   Cervical radiculopathy 02/24/2020   Chronic migraine without aura without status migrainosus, not intractable 02/24/2020   Current Meds  Medication Sig   clobetasol cream (TEMOVATE) 0.05 % Apply topically 2 (two) times daily as needed.   cyclobenzaprine (FLEXERIL) 10 MG tablet Take 1 tablet (10 mg total) by mouth at bedtime as needed for muscle spasms.   diclofenac (VOLTAREN) 75 MG EC tablet Take 1 tablet (75 mg total) by mouth 2 (two) times daily.   Iron, Ferrous Sulfate, 325 (65 Fe) MG TABS Take  325 mg by mouth daily. For iron.   rizatriptan (MAXALT) 5 MG tablet Take 1 tablet by mouth at migraine onset. May repeat in 2 hours if migraine persists.   traMADol (ULTRAM) 50 MG tablet Take 1 tablet (50 mg total) by mouth every 8 (eight) hours as needed for up to 5 days.   triamcinolone cream (KENALOG) 0.1 % Apply 1 application. topically 2 (two) times daily as needed.   valACYclovir (VALTREX) 1000 MG tablet Take 2 tablets (2,000 mg total) by mouth 2 (two) times daily for one day as needed for cold sore outbreak.    Allergies: Patient is allergic to propofol. Family History: Patient family history includes Arthritis in her father; Diabetes in her father, mother, and sister; Heart attack in her mother; Hyperlipidemia in her mother; Hypertension in her father and mother; Kidney disease in her  mother; Thyroid disease in her mother. Social History:  Patient  reports that she has never smoked. She has never used smokeless tobacco. She reports that she does not drink alcohol and does not use drugs.  Review of Systems: Constitutional: Negative for fever malaise or anorexia Cardiovascular: negative for chest pain Respiratory: negative for SOB or persistent cough Gastrointestinal: negative for abdominal pain  Objective  Vitals: BP (!) 144/92   Pulse 86   Temp 98.4 F (36.9 C)   Ht '5\' 4"'$  (1.626 m)   Wt 169 lb 9.6 oz (76.9 kg)   BMI 29.11 kg/m  General: appears uncomfortable. Mildly diaphoretic, A&Ox3 HEENT: PEERL, conjunctiva normal, neck is supple Cardiovascular:  RRR without murmur or gallop.  Respiratory:  Good breath sounds bilaterally, CTAB with normal respiratory effort, no cvat ttp Back: midline and paravertebral mm ttp bilaterally. Decreased forward flexion and extension due to pain. Neg slr bilaterally. +2 DTRs bilateral lower extremities; nl strength.  Skin:  Warm, no rashes  Commons side effects, risks, benefits, and alternatives for medications and treatment plan prescribed today were discussed, and the patient expressed understanding of the given instructions. Patient is instructed to call or message via MyChart if he/she has any questions or concerns regarding our treatment plan. No barriers to understanding were identified. We discussed Red Flag symptoms and signs in detail. Patient expressed understanding regarding what to do in case of urgent or emergency type symptoms.  Medication list was reconciled, printed and provided to the patient in AVS. Patient instructions and summary information was reviewed with the patient as documented in the AVS. This note was prepared with assistance of Dragon voice recognition software. Occasional wrong-word or sound-a-like substitutions may have occurred due to the inherent limitations of voice recognition software  This visit  occurred during the SARS-CoV-2 public health emergency.  Safety protocols were in place, including screening questions prior to the visit, additional usage of staff PPE, and extensive cleaning of exam room while observing appropriate contact time as indicated for disinfecting solutions.

## 2021-10-16 ENCOUNTER — Other Ambulatory Visit: Payer: Self-pay | Admitting: Family Medicine

## 2021-11-02 ENCOUNTER — Encounter: Payer: Self-pay | Admitting: Adult Health

## 2021-11-14 ENCOUNTER — Other Ambulatory Visit: Payer: Self-pay | Admitting: Primary Care

## 2021-11-14 ENCOUNTER — Ambulatory Visit
Admission: RE | Admit: 2021-11-14 | Discharge: 2021-11-14 | Disposition: A | Discharge status: Home / Self Care | Payer: 59 | Source: Ambulatory Visit | Attending: Primary Care | Admitting: Primary Care

## 2021-11-14 DIAGNOSIS — Z1231 Encounter for screening mammogram for malignant neoplasm of breast: Secondary | ICD-10-CM

## 2021-11-25 ENCOUNTER — Other Ambulatory Visit: Payer: Self-pay

## 2021-11-25 DIAGNOSIS — L309 Dermatitis, unspecified: Secondary | ICD-10-CM

## 2021-11-25 MED ORDER — CLOBETASOL PROPIONATE 0.05 % EX CREA: TOPICAL_CREAM | Freq: Two times a day (BID) | CUTANEOUS | 0 refills | Status: DC | PRN

## 2021-11-25 MED ORDER — TRIAMCINOLONE ACETONIDE 0.1 % EX CREA
1.0000 | TOPICAL_CREAM | Freq: Two times a day (BID) | CUTANEOUS | 0 refills | Status: DC | PRN
Start: 2021-11-25 — End: 2022-02-15

## 2021-11-29 ENCOUNTER — Other Ambulatory Visit: Payer: Self-pay

## 2021-11-29 MED ORDER — DICLOFENAC SODIUM 75 MG PO TBEC: 75.0000 mg | DELAYED_RELEASE_TABLET | Freq: Two times a day (BID) | ORAL | 0 refills | Status: DC

## 2021-12-19 ENCOUNTER — Telehealth: Payer: Self-pay | Admitting: Family Medicine

## 2021-12-19 MED ORDER — AMOXICILLIN-POT CLAVULANATE 875-125 MG PO TABS: 1.0000 | ORAL_TABLET | Freq: Two times a day (BID) | ORAL | 0 refills | Status: AC

## 2021-12-19 NOTE — Telephone Encounter (Signed)
Thick nasal congestion, eye and sinus pain and malaise. OTC tx w/o relief.

## 2022-01-09 ENCOUNTER — Encounter (INDEPENDENT_AMBULATORY_CARE_PROVIDER_SITE_OTHER): Payer: 59

## 2022-01-09 DIAGNOSIS — L309 Dermatitis, unspecified: Secondary | ICD-10-CM | POA: Diagnosis not present

## 2022-01-11 NOTE — Telephone Encounter (Signed)

## 2022-01-25 ENCOUNTER — Other Ambulatory Visit (HOSPITAL_COMMUNITY)
Admission: RE | Admit: 2022-01-25 | Discharge: 2022-01-25 | Disposition: A | Discharge status: Home / Self Care | Payer: 59 | Source: Ambulatory Visit | Attending: Family Medicine | Admitting: Family Medicine

## 2022-01-25 ENCOUNTER — Telehealth: Payer: Self-pay | Admitting: Family Medicine

## 2022-01-25 DIAGNOSIS — N898 Other specified noninflammatory disorders of vagina: Secondary | ICD-10-CM | POA: Insufficient documentation

## 2022-01-25 NOTE — Telephone Encounter (Signed)
Thin clear discharge w/o odor or itching.

## 2022-01-26 LAB — CERVICOVAGINAL ANCILLARY ONLY
Bacterial Vaginitis (gardnerella): POSITIVE — AB
Candida Glabrata: NEGATIVE
Candida Vaginitis: POSITIVE — AB
Comment: NEGATIVE
Comment: NEGATIVE
Comment: NEGATIVE

## 2022-01-27 MED ORDER — FLUCONAZOLE 150 MG PO TABS: ORAL_TABLET | ORAL | 0 refills | Status: DC

## 2022-01-27 MED ORDER — METRONIDAZOLE 500 MG PO TABS: 500.0000 mg | ORAL_TABLET | Freq: Two times a day (BID) | ORAL | 0 refills | Status: AC

## 2022-01-27 NOTE — Addendum Note (Signed)
Addended by: Billey Chang on: 01/27/2022 01:02 PM   Modules accepted: Orders

## 2022-02-10 ENCOUNTER — Other Ambulatory Visit (HOSPITAL_COMMUNITY)
Admission: RE | Admit: 2022-02-10 | Discharge: 2022-02-10 | Disposition: A | Discharge status: Home / Self Care | Payer: 59 | Source: Ambulatory Visit | Attending: Family Medicine | Admitting: Family Medicine

## 2022-02-10 ENCOUNTER — Other Ambulatory Visit: Payer: Self-pay | Admitting: Family Medicine

## 2022-02-10 DIAGNOSIS — N898 Other specified noninflammatory disorders of vagina: Secondary | ICD-10-CM

## 2022-02-13 LAB — CERVICOVAGINAL ANCILLARY ONLY
Bacterial Vaginitis (gardnerella): POSITIVE — AB
Candida Glabrata: NEGATIVE
Candida Vaginitis: NEGATIVE
Comment: NEGATIVE
Comment: NEGATIVE
Comment: NEGATIVE

## 2022-02-13 MED ORDER — METRONIDAZOLE 0.75 % VA GEL: 1.0000 | Freq: Every day | VAGINAL | 0 refills | Status: AC

## 2022-02-13 NOTE — Addendum Note (Signed)
Addended by: Billey Chang on: 02/13/2022 02:41 PM   Modules accepted: Orders

## 2022-02-14 ENCOUNTER — Other Ambulatory Visit: Payer: Self-pay | Admitting: Primary Care

## 2022-02-14 DIAGNOSIS — L309 Dermatitis, unspecified: Secondary | ICD-10-CM

## 2022-02-28 ENCOUNTER — Inpatient Hospital Stay: Payer: Commercial Managed Care - PPO | Attending: Adult Health | Admitting: Adult Health

## 2022-02-28 NOTE — Progress Notes (Incomplete)
[name]:    [place] [id number]   DIAGNOSIS: Iron deficiency anemia from chronic blood loss.    SUMMARY OF HEMATOLOGIC HISTORY:  51 y.o. woman with iron deficiency anemia.   Iron deficiency anemia with ferritin of 8 and saturation of 11% Likely secondary to menorrhagia, gynecology evaluation pending IV iron with Venofer x 3   CURRENT THERAPY:  INTERVAL HISTORY: Faith Roth 51 y.o. female returns for    Patient Active Problem List   Diagnosis Date Noted  . Iron deficiency anemia 08/26/2021  . History of cold sores 01/07/2021  . Abdominal wall swelling 10/29/2020  . Exertional shortness of breath 10/29/2020  . S/P abdominoplasty 10/01/2020  . Abnormal vaginal bleeding 08/11/2020  . Prediabetes 08/11/2020  . Pre-op testing 08/11/2020  . Chronic pain of right upper extremity 06/17/2020  . Anemia 06/17/2020  . Urine frequency 04/15/2020  . Chronic constipation 03/05/2020  . Preventative health care 03/05/2020  . Family history of thyroid disease 03/05/2020  . Eczema 03/05/2020  . Cervical radiculopathy 02/24/2020  . Chronic migraine without aura without status migrainosus, not intractable 02/24/2020    is allergic to propofol.  MEDICAL HISTORY: Past Medical History:  Diagnosis Date  . Allergy   . Chicken pox   . Depression   . Migraines   . UTI (urinary tract infection)     SURGICAL HISTORY: Past Surgical History:  Procedure Laterality Date  . ABDOMINOPLASTY    . CESAREAN SECTION  12/04/2004    SOCIAL HISTORY: Social History   Socioeconomic History  . Marital status: Single    Spouse name: Not on file  . Number of children: Not on file  . Years of education: Not on file  . Highest education level: Not on file  Occupational History  . Not on file  Tobacco Use  . Smoking status: Never  . Smokeless tobacco: Never  Substance and Sexual Activity  . Alcohol use: No   . Drug use: No  . Sexual activity: Yes    Birth control/protection: Pill  Other Topics Concern  . Not on file  Social History Narrative  . Not on file   Social Determinants of Health   Financial Resource Strain: Not on file  Food Insecurity: Not on file  Transportation Needs: Not on file  Physical Activity: Not on file  Stress: Not on file  Social Connections: Not on file  Intimate Partner Violence: Not on file    FAMILY HISTORY: Family History  Problem Relation Age of Onset  . Hypertension Mother   . Diabetes Mother   . Heart attack Mother   . Hyperlipidemia Mother   . Kidney disease Mother   . Thyroid disease Mother   . Hypertension Father   . Diabetes Father   . Arthritis Father   . Diabetes Sister     Review of Systems - Oncology    PHYSICAL EXAMINATION  ECOG PERFORMANCE STATUS: {CHL ONC ECOG EN:[id number]}  There were no vitals filed for this visit.  Physical Exam  LABORATORY DATA:  CBC    Component Value Date/Time   WBC 7.3 09/08/2021 1526   RBC 4.02 09/08/2021 1526   HGB 11.3 (L) 09/08/2021 1526   HCT 34.7 (L) 09/08/2021 1526   PLT 417.0 (H) 09/08/2021 1526   MCV 86.5 09/08/2021 1526   MCHC 32.5 09/08/2021 1526   RDW 17.5 (H) 09/08/2021 1526   LYMPHSABS 1.7 09/08/2021 1526  MONOABS 0.6 09/08/2021 1526   EOSABS 0.2 09/08/2021 1526   BASOSABS 0.0 09/08/2021 1526    CMP     Component Value Date/Time   NA 138 08/11/2020 1535   K 4.0 08/11/2020 1535   CL 102 08/11/2020 1535   CO2 29 08/11/2020 1535   GLUCOSE 94 08/11/2020 1535   BUN 14 08/11/2020 1535   CREATININE 0.73 08/11/2020 1535   CALCIUM 9.6 08/11/2020 1535   PROT 7.7 08/11/2020 1535   ALBUMIN 4.3 08/11/2020 1535   AST 14 08/11/2020 1535   ALT 12 08/11/2020 1535   ALKPHOS 76 08/11/2020 1535   BILITOT 0.2 08/11/2020 1535       PENDING LABS:   RADIOGRAPHIC STUDIES:  No results found.   PATHOLOGY:     ASSESSMENT and THERAPY PLAN:   No problem-specific  Assessment & Plan notes found for this encounter.   No orders of the defined types were placed in this encounter.   All questions were answered. The patient knows to call the clinic with any problems, questions or concerns. We can certainly see the patient much sooner if necessary. This note was electronically signed. Scot Dock, NP 02/28/2022

## 2022-03-03 ENCOUNTER — Telehealth (INDEPENDENT_AMBULATORY_CARE_PROVIDER_SITE_OTHER): Payer: 59 | Admitting: Family Medicine

## 2022-03-03 DIAGNOSIS — M545 Low back pain, unspecified: Secondary | ICD-10-CM

## 2022-03-03 MED ORDER — KETOROLAC TROMETHAMINE 60 MG/2ML IM SOLN
60.0000 mg | Freq: Once | INTRAMUSCULAR | Status: AC
  Administered 2022-03-03: 60 mg via INTRAMUSCULAR

## 2022-03-03 NOTE — Telephone Encounter (Signed)
Recurring right low back pain with spasm. Taking nsaid and flexeril.   Rec toradol for pain relief.

## 2022-03-07 ENCOUNTER — Inpatient Hospital Stay: Payer: Commercial Managed Care - PPO | Admitting: Adult Health

## 2022-03-09 ENCOUNTER — Encounter: Payer: Self-pay | Admitting: Adult Health

## 2022-03-09 ENCOUNTER — Encounter: Payer: Self-pay | Admitting: Internal Medicine

## 2022-03-09 ENCOUNTER — Ambulatory Visit: Payer: 59 | Admitting: Internal Medicine

## 2022-03-09 ENCOUNTER — Encounter: Payer: Self-pay | Admitting: *Deleted

## 2022-03-09 VITALS — BP 136/82 | HR 80 | Temp 98.2°F | Ht 64.0 in | Wt 173.0 lb

## 2022-03-09 DIAGNOSIS — M79604 Pain in right leg: Secondary | ICD-10-CM

## 2022-03-09 DIAGNOSIS — G8929 Other chronic pain: Secondary | ICD-10-CM | POA: Insufficient documentation

## 2022-03-09 DIAGNOSIS — M545 Low back pain, unspecified: Secondary | ICD-10-CM

## 2022-03-09 MED ORDER — CYCLOBENZAPRINE HCL 10 MG PO TABS: 10.0000 mg | ORAL_TABLET | Freq: Every evening | ORAL | 0 refills | Status: DC | PRN

## 2022-03-09 MED ORDER — PREDNISONE 20 MG PO TABS: 40.0000 mg | ORAL_TABLET | Freq: Every day | ORAL | 0 refills | Status: DC

## 2022-03-09 MED ORDER — DICLOFENAC SODIUM 75 MG PO TBEC
75.0000 mg | DELAYED_RELEASE_TABLET | Freq: Two times a day (BID) | ORAL | 0 refills | Status: DC
Start: 2022-03-09 — End: 2022-08-30

## 2022-03-09 MED ORDER — TRAMADOL HCL 50 MG PO TABS: 50.0000 mg | ORAL_TABLET | Freq: Three times a day (TID) | ORAL | 0 refills | Status: DC | PRN

## 2022-03-09 NOTE — Assessment & Plan Note (Signed)
Could be sciatica vs HNP Severe pain Will try prednisone 40 x 5, 20 x 5 for inflammation Tramadol #30 for the severe pain Refill diclofenac/cyclobenzaprine Consider ortho evaluation next

## 2022-03-09 NOTE — Progress Notes (Signed)
Subjective:    Patient ID: Faith Roth, female    DOB: March 30, 1970, 51 y.o.   MRN: [id number]  HPI Here due to back pain  1 week ago, bent down pick something up from floor Felt something "pop" Pain started up in the next couple of hours--then was bent over Trouble walking, can't turn in bed, trouble getting up from bed or chair Right low back--down right leg--to knee Leg feels "dead" at times No leg burning, numbness, tingling No inguinal/groin sensory changes  Got toradol injection 12/8-via Dr Andy---didn't help Has to help her right leg along to get it going walking  Has diclofenac and cyclobenzaprine----has tried but not much help Even tried toradol as well  Back trouble back in June Similar now--not necessarily worse  Heat helps a little  Current Outpatient Medications on File Prior to Visit  Medication Sig Dispense Refill   clobetasol cream (TEMOVATE) 0.05 % APPLY TOPICALLY 2 TIMES DAILY AS NEEDED. 30 g 0   cyclobenzaprine (FLEXERIL) 10 MG tablet Take 1 tablet (10 mg total) by mouth at bedtime as needed for muscle spasms. 30 tablet 0   diclofenac (VOLTAREN) 75 MG EC tablet Take 1 tablet (75 mg total) by mouth 2 (two) times daily. 180 tablet 0   Iron, Ferrous Sulfate, 325 (65 Fe) MG TABS Take 325 mg by mouth daily. For iron. 90 tablet 1   triamcinolone cream (KENALOG) 0.1 % APPLY 1 APPLICATION TOPICALLY TWICE A DAY AS NEEDED 30 g 0   valACYclovir (VALTREX) 1000 MG tablet Take 2 tablets (2,000 mg total) by mouth 2 (two) times daily for one day as needed for cold sore outbreak. 12 tablet 0   rizatriptan (MAXALT) 5 MG tablet Take 1 tablet by mouth at migraine onset. May repeat in 2 hours if migraine persists. 10 tablet 0   No current facility-administered medications on file prior to visit.    Allergies  Allergen Reactions   Propofol Other (See Comments)    Wouldn't wake up from anesthesia Pt states, "couldn't be awaken from surgery."     Past Medical  History:  Diagnosis Date   Allergy    Chicken pox    Depression    Migraines    UTI (urinary tract infection)     Past Surgical History:  Procedure Laterality Date   ABDOMINOPLASTY     CESAREAN SECTION  12/04/2004    Family History  Problem Relation Age of Onset   Hypertension Mother    Diabetes Mother    Heart attack Mother    Hyperlipidemia Mother    Kidney disease Mother    Thyroid disease Mother    Hypertension Father    Diabetes Father    Arthritis Father    Diabetes Sister     Social History   Socioeconomic History   Marital status: Single    Spouse name: Not on file   Number of children: Not on file   Years of education: Not on file   Highest education level: Not on file  Occupational History   Not on file  Tobacco Use   Smoking status: Never   Smokeless tobacco: Never  Substance and Sexual Activity   Alcohol use: No   Drug use: No   Sexual activity: Yes    Birth control/protection: Pill  Other Topics Concern   Not on file  Social History Narrative   Not on file   Social Determinants of Health   Financial Resource Strain: Not on file  Food  Insecurity: Not on file  Transportation Needs: Not on file  Physical Activity: Not on file  Stress: Not on file  Social Connections: Not on file  Intimate Partner Violence: Not on file   Review of Systems No loss of bladder or bowel function    Objective:   Physical Exam Constitutional:      Comments: Suffering in pain with any significant movement Trouble getting into and out of chair---and on table  Musculoskeletal:     Comments: No spine tenderness Hip ROM is fine SLR positive on right and contralateral  Neurological:     Mental Status: She is alert.     Comments: Severe antalgic gait Some weakness in right hip flexors            Assessment & Plan:

## 2022-03-11 ENCOUNTER — Encounter: Payer: Self-pay | Admitting: Adult Health

## 2022-03-13 ENCOUNTER — Inpatient Hospital Stay: Payer: Commercial Managed Care - PPO | Admitting: Adult Health

## 2022-03-15 ENCOUNTER — Ambulatory Visit: Payer: 59 | Admitting: Primary Care

## 2022-03-15 ENCOUNTER — Encounter: Payer: Self-pay | Admitting: Primary Care

## 2022-03-15 VITALS — BP 138/84 | HR 98 | Temp 98.2°F | Ht 64.0 in | Wt 171.0 lb

## 2022-03-15 DIAGNOSIS — M545 Low back pain, unspecified: Secondary | ICD-10-CM

## 2022-03-15 DIAGNOSIS — G43709 Chronic migraine without aura, not intractable, without status migrainosus: Secondary | ICD-10-CM

## 2022-03-15 DIAGNOSIS — R7303 Prediabetes: Secondary | ICD-10-CM

## 2022-03-15 DIAGNOSIS — L309 Dermatitis, unspecified: Secondary | ICD-10-CM

## 2022-03-15 DIAGNOSIS — Z1211 Encounter for screening for malignant neoplasm of colon: Secondary | ICD-10-CM

## 2022-03-15 DIAGNOSIS — R232 Flushing: Secondary | ICD-10-CM | POA: Diagnosis not present

## 2022-03-15 DIAGNOSIS — E538 Deficiency of other specified B group vitamins: Secondary | ICD-10-CM

## 2022-03-15 DIAGNOSIS — D509 Iron deficiency anemia, unspecified: Secondary | ICD-10-CM | POA: Diagnosis not present

## 2022-03-15 DIAGNOSIS — L700 Acne vulgaris: Secondary | ICD-10-CM | POA: Diagnosis not present

## 2022-03-15 DIAGNOSIS — Z8619 Personal history of other infectious and parasitic diseases: Secondary | ICD-10-CM

## 2022-03-15 DIAGNOSIS — M5412 Radiculopathy, cervical region: Secondary | ICD-10-CM

## 2022-03-15 DIAGNOSIS — Z0000 Encounter for general adult medical examination without abnormal findings: Secondary | ICD-10-CM

## 2022-03-15 DIAGNOSIS — Z0001 Encounter for general adult medical examination with abnormal findings: Secondary | ICD-10-CM

## 2022-03-15 DIAGNOSIS — N898 Other specified noninflammatory disorders of vagina: Secondary | ICD-10-CM

## 2022-03-15 DIAGNOSIS — M79604 Pain in right leg: Secondary | ICD-10-CM

## 2022-03-15 LAB — COMPREHENSIVE METABOLIC PANEL
ALT: 18 U/L (ref 0–35)
AST: 12 U/L (ref 0–37)
Albumin: 4.3 g/dL (ref 3.5–5.2)
Alkaline Phosphatase: 75 U/L (ref 39–117)
BUN: 12 mg/dL (ref 6–23)
CO2: 31 mEq/L (ref 19–32)
Calcium: 9.6 mg/dL (ref 8.4–10.5)
Chloride: 102 mEq/L (ref 96–112)
Creatinine, Ser: 0.8 mg/dL (ref 0.40–1.20)
GFR: 85.55 mL/min (ref >60.00)
Glucose, Bld: 109 mg/dL — ABNORMAL HIGH (ref 70–99)
Potassium: 4.1 mEq/L (ref 3.5–5.1)
Sodium: 138 mEq/L (ref 135–145)
Total Bilirubin: 0.4 mg/dL (ref 0.2–1.2)
Total Protein: 7.5 g/dL (ref 6.0–8.3)

## 2022-03-15 LAB — IBC + FERRITIN
Ferritin: 30.2 ng/mL (ref 10.0–291.0)
Iron: 70 ug/dL (ref 42–145)
Saturation Ratios: 19.1 % — ABNORMAL LOW (ref 20.0–50.0)
TIBC: 366.8 ug/dL (ref 250.0–450.0)
Transferrin: 262 mg/dL (ref 212.0–360.0)

## 2022-03-15 LAB — LIPID PANEL
Cholesterol: 212 mg/dL — ABNORMAL HIGH (ref 0–200)
HDL: 55.8 mg/dL (ref >39.00)
LDL Cholesterol: 139 mg/dL — ABNORMAL HIGH (ref 0–99)
NonHDL: 156.43
Total CHOL/HDL Ratio: 4
Triglycerides: 89 mg/dL (ref 0.0–149.0)
VLDL: 17.8 mg/dL (ref 0.0–40.0)

## 2022-03-15 LAB — CBC
HCT: 39 % (ref 36.0–46.0)
Hemoglobin: 13.4 g/dL (ref 12.0–15.0)
MCHC: 34.4 g/dL (ref 30.0–36.0)
MCV: 88.8 fl (ref 78.0–100.0)
Platelets: 426 10*3/uL — ABNORMAL HIGH (ref 150.0–400.0)
RBC: 4.39 Mil/uL (ref 3.87–5.11)
RDW: 13.2 % (ref 11.5–15.5)
WBC: 10.3 10*3/uL (ref 4.0–10.5)

## 2022-03-15 LAB — VITAMIN B12: Vitamin B-12: 899 pg/mL (ref 211–911)

## 2022-03-15 LAB — HEMOGLOBIN A1C: Hgb A1c MFr Bld: 6.3 % (ref 4.6–6.5)

## 2022-03-15 LAB — VITAMIN D 25 HYDROXY (VIT D DEFICIENCY, FRACTURES): VITD: 13.4 ng/mL — ABNORMAL LOW (ref 30.00–100.00)

## 2022-03-15 MED ORDER — VALACYCLOVIR HCL 500 MG PO TABS: 500.0000 mg | ORAL_TABLET | Freq: Every day | ORAL | 0 refills | Status: DC

## 2022-03-15 MED ORDER — TRETINOIN 0.025 % EX CREA: TOPICAL_CREAM | Freq: Every day | CUTANEOUS | 0 refills | Status: DC

## 2022-03-15 MED ORDER — VENLAFAXINE HCL ER 37.5 MG PO CP24: 37.5000 mg | ORAL_CAPSULE | Freq: Every day | ORAL | 0 refills | Status: DC

## 2022-03-15 MED ORDER — FLUCONAZOLE 150 MG PO TABS: 150.0000 mg | ORAL_TABLET | Freq: Once | ORAL | 0 refills | Status: AC

## 2022-03-15 NOTE — Assessment & Plan Note (Signed)
Due for Shingrix vaccines, she will have this done after this coming weekend. Other vaccines UTD. Pap smear UTD. Mammogram UTD. Colonoscopy due, referral placed to Gi.  Discussed the importance of a healthy diet and regular exercise in order for weight loss, and to reduce the risk of further co-morbidity.  Exam stable. Labs pending.  Follow up in 1 year for repeat physical.

## 2022-03-15 NOTE — Assessment & Plan Note (Signed)
Repeat CBC pending. Irregular menses now, no recent bleeding.

## 2022-03-15 NOTE — Assessment & Plan Note (Signed)
Controlled.  Continue to monitor.  

## 2022-03-15 NOTE — Assessment & Plan Note (Signed)
To establish with dermatology in December 2023.  Continue clobetasol 0.05% cream PRN. Continue triamcinolone 0.1% cream PRN.  Refill provided for Trenonin.

## 2022-03-15 NOTE — Progress Notes (Signed)
Subjective:    Patient ID: Faith Roth, female    DOB: 1970-07-20, 51 y.o.   MRN: [id number]  HPI  Faith Roth is a very pleasant 51 y.o. female who presents today for complete physical and follow up of chronic conditions.  She continues to experience right lower back pain with radiation of pain down to her lower extremity. Pain is worse when rising from a seated position. Evaluated by Dr. Silvio Pate last week, treated with prednisone, diclofenac, and Tramadol. She denies a traumatic event except for wiping up some milk on the floor.   She would also like to mention hot flashes which began about 2 months ago. Symptoms include sweats intermittent and recurrent during the day and evening, lasting several minutes. Her last menstrual cycle was 2 months ago. She is interested in treatment for hot flashes. She denies mood swings or feelings of increased anxiety/depression.   She would also like fluconazole for recent candida infection. Evaluated by [facility] provider in November 2023, treated for BV with metronidazole course. Since taking metronidazole she has noticed vaginal itching with thick whitish discharge. She would like treatment for antibiotic induced yeast infection.   She is also requesting a prescription for Retin-A for facial acne that began about 2 months ago since perimenopause symptoms.   Immunizations: -Tetanus: 2019 -Influenza: Completed this season  -Shingles: Never completed   Diet: Fair diet.  Exercise: No regular exercise.  Eye exam: Completed several years ago. Dental exam: Completed several years ago.  Pap Smear: Completed in December 2021 Mammogram: Completed in August 2023  Colonoscopy: Due in 2023  BP Readings from Last 3 Encounters:  03/15/22 138/84  03/09/22 136/82  09/28/21 (!) 144/92       Review of Systems  Constitutional:  Negative for unexpected weight change.  HENT:  Negative for rhinorrhea.   Eyes:  Negative for visual  disturbance.  Respiratory:  Negative for cough and shortness of breath.   Cardiovascular:  Negative for chest pain.  Gastrointestinal:  Negative for constipation and diarrhea.  Genitourinary:  Positive for menstrual problem and vaginal discharge. Negative for difficulty urinating.       Oral herpes outbreaks  Musculoskeletal:  Positive for arthralgias and back pain. Negative for myalgias.  Skin:  Positive for rash.  Allergic/Immunologic: Negative for environmental allergies.  Neurological:  Positive for weakness and numbness. Negative for dizziness and headaches.  Psychiatric/Behavioral:  The patient is not nervous/anxious.          Past Medical History:  Diagnosis Date   Allergy    Chicken pox    Depression    Migraines    UTI (urinary tract infection)     Social History   Socioeconomic History   Marital status: Single    Spouse name: Not on file   Number of children: Not on file   Years of education: Not on file   Highest education level: Not on file  Occupational History   Not on file  Tobacco Use   Smoking status: Never   Smokeless tobacco: Never  Substance and Sexual Activity   Alcohol use: No   Drug use: No   Sexual activity: Yes    Birth control/protection: Pill  Other Topics Concern   Not on file  Social History Narrative   Not on file   Social Determinants of Health   Financial Resource Strain: Not on file  Food Insecurity: Not on file  Transportation Needs: Not on file  Physical Activity: Not  on file  Stress: Not on file  Social Connections: Not on file  Intimate Partner Violence: Not on file    Past Surgical History:  Procedure Laterality Date   ABDOMINOPLASTY     CESAREAN SECTION  12/04/2004    Family History  Problem Relation Age of Onset   Hypertension Mother    Diabetes Mother    Heart attack Mother    Hyperlipidemia Mother    Kidney disease Mother    Thyroid disease Mother    Hypertension Father    Diabetes Father    Arthritis  Father    Diabetes Sister     Allergies  Allergen Reactions   Propofol Other (See Comments)    Wouldn't wake up from anesthesia Pt states, "couldn't be awaken from surgery."     Current Outpatient Medications on File Prior to Visit  Medication Sig Dispense Refill   clobetasol cream (TEMOVATE) 0.05 % APPLY TOPICALLY 2 TIMES DAILY AS NEEDED. 30 g 0   cyclobenzaprine (FLEXERIL) 10 MG tablet Take 1 tablet (10 mg total) by mouth at bedtime as needed for muscle spasms. 30 tablet 0   diclofenac (VOLTAREN) 75 MG EC tablet Take 1 tablet (75 mg total) by mouth 2 (two) times daily. 180 tablet 0   Iron, Ferrous Sulfate, 325 (65 Fe) MG TABS Take 325 mg by mouth daily. For iron. 90 tablet 1   predniSONE (DELTASONE) 20 MG tablet Take 2 tablets (40 mg total) by mouth daily. For 5 days, then 1 tab daily for 5 days 15 tablet 0   traMADol (ULTRAM) 50 MG tablet Take 1 tablet (50 mg total) by mouth 3 (three) times daily as needed. 15 tablet 0   triamcinolone cream (KENALOG) 0.1 % APPLY 1 APPLICATION TOPICALLY TWICE A DAY AS NEEDED 30 g 0   No current facility-administered medications on file prior to visit.    BP 138/84   Pulse 98   Temp 98.2 F (36.8 C) (Temporal)   Ht '5\' 4"'$  (1.626 m)   Wt 171 lb (77.6 kg)   LMP 01/08/2022 (Exact Date)   SpO2 98%   BMI 29.35 kg/m  Objective:   Physical Exam HENT:     Right Ear: Tympanic membrane and ear canal normal.     Left Ear: Tympanic membrane and ear canal normal.     Nose: Nose normal.  Eyes:     Conjunctiva/sclera: Conjunctivae normal.     Pupils: Pupils are equal, round, and reactive to light.  Neck:     Thyroid: No thyromegaly.  Cardiovascular:     Rate and Rhythm: Normal rate and regular rhythm.     Heart sounds: No murmur heard. Pulmonary:     Effort: Pulmonary effort is normal.     Breath sounds: Normal breath sounds. No rales.  Abdominal:     General: Bowel sounds are normal.     Palpations: Abdomen is soft.     Tenderness: There is  no abdominal tenderness.  Musculoskeletal:        General: Normal range of motion.     Cervical back: Neck supple.  Lymphadenopathy:     Cervical: No cervical adenopathy.  Skin:    General: Skin is warm and dry.     Findings: No rash.  Neurological:     Mental Status: She is alert and oriented to person, place, and time.     Cranial Nerves: No cranial nerve deficit.     Deep Tendon Reflexes: Reflexes are normal and symmetric.  Psychiatric:        Mood and Affect: Mood normal.           Assessment & Plan:   Problem List Items Addressed This Visit       Cardiovascular and Mediastinum   Chronic migraine without aura without status migrainosus, not intractable    Controlled.  Continue to monitor.      Relevant Medications   venlafaxine XR (EFFEXOR XR) 37.5 MG 24 hr capsule     Nervous and Auditory   Cervical radiculopathy    Continued but overall stable.  Consider referral back to neurosurgery. She will update.      Relevant Medications   venlafaxine XR (EFFEXOR XR) 37.5 MG 24 hr capsule     Musculoskeletal and Integument   Eczema    To establish with dermatology in December 2023.  Continue clobetasol 0.05% cream PRN. Continue triamcinolone 0.1% cream PRN.  Refill provided for Trenonin.         Other   Encounter for annual general medical examination with abnormal findings in adult - Primary    Due for Shingrix vaccines, she will have this done after this coming weekend. Other vaccines UTD. Pap smear UTD. Mammogram UTD. Colonoscopy due, referral placed to Gi.  Discussed the importance of a healthy diet and regular exercise in order for weight loss, and to reduce the risk of further co-morbidity.  Exam stable. Labs pending.  Follow up in 1 year for repeat physical.       Anemia    Repeat CBC pending. Irregular menses now, no recent bleeding.      Relevant Orders   CBC   IBC + Ferritin   Prediabetes    Discussed the importance of a healthy  diet and regular exercise in order for weight loss, and to reduce the risk of further co-morbidity.  Repeat A1C pending.      Relevant Orders   Hemoglobin A1c   Lipid panel   Comprehensive metabolic panel   History of cold sores    Outbreaks occurring 1-2 times every month to every other month.  Start Valtrex 500 mg daily for herpes prevention. We will continue this for 90 days.   She will update at that point.       Relevant Medications   valACYclovir (VALTREX) 500 MG tablet   Low back pain radiating to right leg    Improved slightly.  Continue prednisone as prescribed Continue diclofenac 75 mg BID PRN. Continue cyclobenzaprine 5-10 mg PRN.  Discouraged use of Tramadol unless needed.       Other Visit Diagnoses     Screening for colon cancer       Relevant Orders   Ambulatory referral to Gastroenterology   Acne vulgaris       Relevant Medications   tretinoin (RETIN-A) 0.025 % cream   Hot flashes       Relevant Medications   venlafaxine XR (EFFEXOR XR) 37.5 MG 24 hr capsule   Vaginal discharge       Relevant Medications   fluconazole (DIFLUCAN) 150 MG tablet   Vitamin B12 deficiency       Relevant Orders   VITAMIN D 25 Hydroxy (Vit-D Deficiency, Fractures)   Vitamin B12          Pleas Koch, NP

## 2022-03-15 NOTE — Assessment & Plan Note (Signed)
Improved slightly.  Continue prednisone as prescribed Continue diclofenac 75 mg BID PRN. Continue cyclobenzaprine 5-10 mg PRN.  Discouraged use of Tramadol unless needed.

## 2022-03-15 NOTE — Assessment & Plan Note (Signed)
Outbreaks occurring 1-2 times every month to every other month.  Start Valtrex 500 mg daily for herpes prevention. We will continue this for 90 days.   She will update at that point.

## 2022-03-15 NOTE — Assessment & Plan Note (Signed)
Continued but overall stable.  Consider referral back to neurosurgery. She will update.

## 2022-03-15 NOTE — Patient Instructions (Addendum)
Stop by the lab prior to leaving today. I will notify you of your results once received.   Complete the Shingrix vaccines as discussed.  Start venlafaxine ER 37.5 mg every morning with breakfast for hot flashes. Please update me as discussed.   You may take the fluconazole pill once for vaginal itching.  You will either be contacted via phone regarding your referral to GI for the colonoscopy, or you may receive a letter on your MyChart portal from our referral team with instructions for scheduling an appointment. Please let us know if you have not been contacted by anyone within two weeks.  Start Valtrex every day for herpes outbreak prevention.  It was a pleasure to see you today!  Preventive Care 70-49 Years Old, Female Preventive care refers to lifestyle choices and visits with your health care provider that can promote health and wellness. Preventive care visits are also called wellness exams. What can I expect for my preventive care visit? Counseling Your health care provider may ask you questions about your: Medical history, including: Past medical problems. Family medical history. Pregnancy history. Current health, including: Menstrual cycle. Method of birth control. Emotional well-being. Home life and relationship well-being. Sexual activity and sexual health. Lifestyle, including: Alcohol, nicotine or tobacco, and drug use. Access to firearms. Diet, exercise, and sleep habits. Work and work Statistician. Sunscreen use. Safety issues such as seatbelt and bike helmet use. Physical exam Your health care provider will check your: Height and weight. These may be used to calculate your BMI (body mass index). BMI is a measurement that tells if you are at a healthy weight. Waist circumference. This measures the distance around your waistline. This measurement also tells if you are at a healthy weight and may help predict your risk of certain diseases, such as type 2 diabetes  and high blood pressure. Heart rate and blood pressure. Body temperature. Skin for abnormal spots. What immunizations do I need?  Vaccines are usually given at various ages, according to a schedule. Your health care provider will recommend vaccines for you based on your age, medical history, and lifestyle or other factors, such as travel or where you work. What tests do I need? Screening Your health care provider may recommend screening tests for certain conditions. This may include: Lipid and cholesterol levels. Diabetes screening. This is done by checking your blood sugar (glucose) after you have not eaten for a while (fasting). Pelvic exam and Pap test. Hepatitis B test. Hepatitis C test. HIV (human immunodeficiency virus) test. STI (sexually transmitted infection) testing, if you are at risk. Lung cancer screening. Colorectal cancer screening. Mammogram. Talk with your health care provider about when you should start having regular mammograms. This may depend on whether you have a family history of breast cancer. BRCA-related cancer screening. This may be done if you have a family history of breast, ovarian, tubal, or peritoneal cancers. Bone density scan. This is done to screen for osteoporosis. Talk with your health care provider about your test results, treatment options, and if necessary, the need for more tests. Follow these instructions at home: Eating and drinking  Eat a diet that includes fresh fruits and vegetables, whole grains, lean protein, and low-fat dairy products. Take vitamin and mineral supplements as recommended by your health care provider. Do not drink alcohol if: Your health care provider tells you not to drink. You are pregnant, may be pregnant, or are planning to become pregnant. If you drink alcohol: Limit how much you have to  0-1 drink a day. Know how much alcohol is in your drink. In the [place] one drink equals one 12 oz bottle of beer (355 mL), one 5 oz  glass of wine (148 mL), or one 1 oz glass of hard liquor (44 mL). Lifestyle Brush your teeth every morning and night with fluoride toothpaste. Floss one time each day. Exercise for at least 30 minutes 5 or more days each week. Do not use any products that contain nicotine or tobacco. These products include cigarettes, chewing tobacco, and vaping devices, such as e-cigarettes. If you need help quitting, ask your health care provider. Do not use drugs. If you are sexually active, practice safe sex. Use a condom or other form of protection to prevent STIs. If you do not wish to become pregnant, use a form of birth control. If you plan to become pregnant, see your health care provider for a prepregnancy visit. Take aspirin only as told by your health care provider. Make sure that you understand how much to take and what form to take. Work with your health care provider to find out whether it is safe and beneficial for you to take aspirin daily. Find healthy ways to manage stress, such as: Meditation, yoga, or listening to music. Journaling. Talking to a trusted person. Spending time with friends and family. Minimize exposure to UV radiation to reduce your risk of skin cancer. Safety Always wear your seat belt while driving or riding in a vehicle. Do not drive: If you have been drinking alcohol. Do not ride with someone who has been drinking. When you are tired or distracted. While texting. If you have been using any mind-altering substances or drugs. Wear a helmet and other protective equipment during sports activities. If you have firearms in your house, make sure you follow all gun safety procedures. Seek help if you have been physically or sexually abused. What's next? Visit your health care provider once a year for an annual wellness visit. Ask your health care provider how often you should have your eyes and teeth checked. Stay up to date on all vaccines. This information is not  intended to replace advice given to you by your health care provider. Make sure you discuss any questions you have with your health care provider. Document Revised: 09/08/2020 Document Reviewed: 09/08/2020 Elsevier Patient Education  [place].

## 2022-03-15 NOTE — Assessment & Plan Note (Signed)
Discussed the importance of a healthy diet and regular exercise in order for weight loss, and to reduce the risk of further co-morbidity. ? ?Repeat A1C pending. ?

## 2022-03-16 ENCOUNTER — Telehealth: Payer: Self-pay | Admitting: Adult Health

## 2022-03-16 NOTE — Telephone Encounter (Signed)
Rescheduled appointment per 12/20 staff message. Left voicemail.

## 2022-03-22 DIAGNOSIS — L218 Other seborrheic dermatitis: Secondary | ICD-10-CM | POA: Diagnosis not present

## 2022-03-22 DIAGNOSIS — L308 Other specified dermatitis: Secondary | ICD-10-CM | POA: Diagnosis not present

## 2022-03-22 DIAGNOSIS — L700 Acne vulgaris: Secondary | ICD-10-CM | POA: Diagnosis not present

## 2022-03-30 ENCOUNTER — Encounter: Payer: Self-pay | Admitting: Adult Health

## 2022-04-06 ENCOUNTER — Inpatient Hospital Stay: Payer: 59 | Attending: Adult Health | Admitting: Adult Health

## 2022-04-07 DIAGNOSIS — E559 Vitamin D deficiency, unspecified: Secondary | ICD-10-CM

## 2022-04-07 MED ORDER — VITAMIN D (ERGOCALCIFEROL) 1.25 MG (50000 UNIT) PO CAPS: ORAL_CAPSULE | ORAL | 0 refills | Status: DC

## 2022-04-18 ENCOUNTER — Encounter: Payer: Self-pay | Admitting: Family Medicine

## 2022-04-18 ENCOUNTER — Ambulatory Visit (INDEPENDENT_AMBULATORY_CARE_PROVIDER_SITE_OTHER): Payer: 59 | Admitting: Family Medicine

## 2022-04-18 VITALS — BP 130/93 | HR 132 | Temp 98.5°F | Ht 64.0 in | Wt 169.8 lb

## 2022-04-18 DIAGNOSIS — B349 Viral infection, unspecified: Secondary | ICD-10-CM

## 2022-04-18 DIAGNOSIS — E860 Dehydration: Secondary | ICD-10-CM | POA: Diagnosis not present

## 2022-04-18 DIAGNOSIS — R52 Pain, unspecified: Secondary | ICD-10-CM

## 2022-04-18 LAB — POCT INFLUENZA A/B
Influenza A, POC: NEGATIVE
Influenza B, POC: NEGATIVE

## 2022-04-18 LAB — POC COVID19 BINAXNOW: SARS Coronavirus 2 Ag: NEGATIVE

## 2022-04-18 NOTE — Patient Instructions (Addendum)
Please follow up if symptoms do not improve or as needed.    Ibuprofen '600mg'$  q 6-8 hours as needed for body aches, headache, eye pain, or sore throat and fever.  Drink more fluids. You are dehydrated.   Viral Illness, Adult Viruses are tiny germs that can get into a person's body and cause illness. There are many different types of viruses, and they cause many types of illness. Viral illnesses can range from mild to severe. They can affect various parts of the body. Short-term conditions that are caused by a virus include colds and the flu (influenza). Long-term conditions that are caused by a virus include herpes, shingles, and HIV (human immunodeficiency virus) infection. A few viruses have been linked to certain cancers. What are the causes? Many types of viruses can cause illness. Viruses invade cells in your body, multiply, and cause the infected cells to work abnormally or die. When these cells die, they release more of the virus. When this happens, you develop symptoms of the illness, and the virus continues to spread to other cells. If the virus takes over the function of the cell, it can cause the cell to divide and grow out of control. This happens when a virus causes cancer. Different viruses get into the body in different ways. You can get a virus by: Swallowing food or water that has come in contact with the virus (is contaminated). Breathing in droplets that have been coughed or sneezed into the air by an infected person. Touching a surface that has been contaminated with the virus and then touching your eyes, nose, or mouth. Being bitten by an insect or animal that carries the virus. Having sexual contact with a person who is infected with the virus. Being exposed to blood or fluids that contain the virus, either through an open cut or during a transfusion. If a virus enters your body, your body's defense system (immune system) will try to fight the virus. You may be at higher risk  for a viral illness if your immune system is weak. What are the signs or symptoms? You may have these symptoms, depending on the type of virus and the location of the cells that it invades: Cold and flu viruses: Fever. Headache. Sore throat. Muscle aches. Stuffy nose (nasal congestion). Cough. Digestive system (gastrointestinal) viruses: Fever. Pain in the abdomen. Nausea. Diarrhea. Liver viruses (hepatitis): Loss of appetite. Tiredness. Skin or the white parts of your eyes turning yellow (jaundice). Brain and spinal cord viruses: Fever. Headache. Stiff neck. Nausea and vomiting. Confusion or sleepiness. Skin viruses: Warts. Itching. Rash. Sexually transmitted viruses: Discharge. Swelling. Redness. Rash. How is this diagnosed? This condition may be diagnosed based on one or more of the following: Symptoms. Medical history. Physical exam. Blood test, sample of mucus from your lungs (sputum sample), stool sample, or a swab of body fluids or a skin sore (lesion). How is this treated? Viruses can be hard to treat because they live within cells. Antibiotic medicines do not treat viruses because these medicines do not get inside cells. Treatment for a viral illness may include: Resting and drinking plenty of fluids. Medicines to relieve symptoms. These can include over-the-counter medicine for pain and fever, medicines for cough or congestion, and medicines to relieve diarrhea. Antiviral medicines. These medicines are available only for certain types of viruses. Some viral illnesses can be prevented with vaccinations. A common example is the flu shot. Follow these instructions at home: Medicines Take over-the-counter and prescription medicines only as  told by your health care provider. If you were prescribed an antiviral medicine, take it as told by your health care provider. Do not stop taking the antiviral even if you start to feel better. Be aware of when antibiotics  are needed and when they are not needed. Antibiotics do not treat viruses. You may get an antibiotic if your health care provider thinks that you may have, or are at risk for, a bacterial infection and you have a viral infection. Do not ask for an antibiotic prescription if you have been diagnosed with a viral illness. Antibiotics will not make your illness go away faster. Frequently taking antibiotics when they are not needed can lead to antibiotic resistance. When this develops, the medicine no longer works against the bacteria that it normally fights. General instructions  Drink enough fluids to keep your urine pale yellow. Rest as much as possible. Return to your normal activities as told by your health care provider. Ask your health care provider what activities are safe for you. Keep all follow-up visits as told by your health care provider. This is important. How is this prevented? To reduce your risk of viral illness: Wash your hands often with soap and water for at least 20 seconds. If soap and water are not available, use hand sanitizer. Avoid touching your nose, eyes, and mouth, especially if you have not washed your hands recently. If anyone in your household has a viral infection, clean all household surfaces that may have been in contact with the virus. Use soap and hot water. You may also use bleach that you have added water to (diluted). Stay away from people who are sick with symptoms of a viral infection. Do not share items such as toothbrushes and water bottles with other people. Keep your vaccinations up to date. This includes getting a yearly flu shot. Eat a healthy diet and get plenty of rest. Contact a health care provider if: You have symptoms of a viral illness that do not go away. Your symptoms come back after going away. Your symptoms get worse. Get help right away if you have: Trouble breathing. A severe headache or a stiff neck. Severe vomiting or pain in your  abdomen. These symptoms may represent a serious problem that is an emergency. Do not wait to see if the symptoms will go away. Get medical help right away. Call your local emergency services (911 in the [place] Do not drive yourself to the hospital. Summary Viruses are types of germs that can get into a person's body and cause illness. Viral illnesses can range from mild to severe. They can affect various parts of the body. Viruses can be hard to treat. There are medicines to relieve symptoms, and there are some antiviral medicines. If you were prescribed an antiviral medicine, take it as told by your health care provider. Do not stop taking the antiviral even if you start to feel better. Contact a health care provider if you have symptoms of a viral illness that do not go away. This information is not intended to replace advice given to you by your health care provider. Make sure you discuss any questions you have with your health care provider. Document Revised: 07/28/2019 Document Reviewed: 01/21/2019 Elsevier Patient Education  [place].

## 2022-04-18 NOTE — Progress Notes (Signed)
Subjective  CC:  Chief Complaint  Patient presents with   Sore Throat    Pt states sx started Sunday morning, pt states had headache since Sunday, pt wasn't feeling good at work and everything got worse after work   bodyache   Fever    HPI: Faith Roth is a 52 y.o. female who presents to the office today to address the problems listed above in the chief complaint. As above, fever: t max 101+, this am 100.2 responsive to tylenol. C/o ST, headache, congestion with thick pnd, hacking cough and body aches x 2-3 days. No sob, n/v/d or abdominal pain. Extremely tired. Hasn't had anything to eat today and only drank a few sips of gatorade to take with her medication.  No sick contacts at home. Works in Teacher, music.  Has voided today: dark urine. No urinary sxs.   Assessment  1. Viral syndrome   2. Body aches   3. Dehydration      Plan  Viral syndrome:  suppotive care. No signs of serious bacterial illness. Tylenol/advil, mucinex dm and recommended improved hydration and rest. F/u if not improving Tachy due to deydration. Avoid caffeine beverages. Water, gatorade, propel, popsicles, broth etc .  Follow up: prn  Visit date not found  Orders Placed This Encounter  Procedures   POC COVID-19   POCT Influenza A/B   No orders of the defined types were placed in this encounter.     I reviewed the patients updated PMH, FH, and SocHx.    Patient Active Problem List   Diagnosis Date Noted   Low back pain radiating to right leg 03/09/2022   Iron deficiency anemia 08/26/2021   History of cold sores 01/07/2021   Abdominal wall swelling 10/29/2020   Exertional shortness of breath 10/29/2020   S/P abdominoplasty 10/01/2020   Abnormal vaginal bleeding 08/11/2020   Prediabetes 08/11/2020   Pre-op testing 08/11/2020   Chronic pain of right upper extremity 06/17/2020   Anemia 06/17/2020   Urine frequency 04/15/2020   Chronic constipation 03/05/2020   Encounter for annual  general medical examination with abnormal findings in adult 03/05/2020   Family history of thyroid disease 03/05/2020   Eczema 03/05/2020   Cervical radiculopathy 02/24/2020   Chronic migraine without aura without status migrainosus, not intractable 02/24/2020   Current Meds  Medication Sig   clobetasol cream (TEMOVATE) 0.05 % APPLY TOPICALLY 2 TIMES DAILY AS NEEDED.   cyclobenzaprine (FLEXERIL) 10 MG tablet Take 1 tablet (10 mg total) by mouth at bedtime as needed for muscle spasms.   diclofenac (VOLTAREN) 75 MG EC tablet Take 1 tablet (75 mg total) by mouth 2 (two) times daily.   Iron, Ferrous Sulfate, 325 (65 Fe) MG TABS Take 325 mg by mouth daily. For iron.   traMADol (ULTRAM) 50 MG tablet Take 1 tablet (50 mg total) by mouth 3 (three) times daily as needed.   tretinoin (RETIN-A) 0.025 % cream Apply topically at bedtime.   triamcinolone cream (KENALOG) 0.1 % APPLY 1 APPLICATION TOPICALLY TWICE A DAY AS NEEDED   valACYclovir (VALTREX) 500 MG tablet Take 1 tablet (500 mg total) by mouth daily. For outbreak prevention.   venlafaxine XR (EFFEXOR XR) 37.5 MG 24 hr capsule Take 1 capsule (37.5 mg total) by mouth daily with breakfast. For hot flashes.   Vitamin D, Ergocalciferol, (DRISDOL) 1.25 MG (50000 UNIT) CAPS capsule Take 1 capsule by mouth once weekly for 12 weeks.   [DISCONTINUED] predniSONE (DELTASONE) 20 MG  tablet Take 2 tablets (40 mg total) by mouth daily. For 5 days, then 1 tab daily for 5 days    Allergies: Patient is allergic to propofol. Family History: Patient family history includes Arthritis in her father; Diabetes in her father, mother, and sister; Heart attack in her mother; Hyperlipidemia in her mother; Hypertension in her father and mother; Kidney disease in her mother; Thyroid disease in her mother. Social History:  Patient  reports that she has never smoked. She has never used smokeless tobacco. She reports that she does not drink alcohol and does not use  drugs.  Review of Systems: Constitutional: Negative for fever malaise or anorexia Cardiovascular: negative for chest pain Respiratory: negative for SOB or persistent cough Gastrointestinal: negative for abdominal pain  Objective  Vitals: BP (!) 130/93 (BP Location: Right Arm, Patient Position: Sitting)   Pulse (!) 132   Temp 98.5 F (36.9 C) (Temporal)   Ht '5\' 4"'$  (1.626 m)   Wt 169 lb 12.8 oz (77 kg)   LMP 01/08/2022 (Exact Date)   SpO2 97%   BMI 29.15 kg/m  General: non toxic but fatigued and congested, A&Ox3 HEENT: PEERL, conjunctiva normal, neck is supple, nl Tms, oropharynx w/ mild erythema w/o exudate. + LAD Cardiovascular:  RRR without murmur or gallop.  Respiratory:  Good breath sounds bilaterally, CTAB with normal respiratory effort Skin:  Warm, no rashes Office Visit on 04/18/2022  Component Date Value Ref Range Status   SARS Coronavirus 2 Ag 04/18/2022 Negative  Negative Final   Influenza A, POC 04/18/2022 Negative  Negative Final   Influenza B, POC 04/18/2022 Negative  Negative Final    Commons side effects, risks, benefits, and alternatives for medications and treatment plan prescribed today were discussed, and the patient expressed understanding of the given instructions. Patient is instructed to call or message via MyChart if he/she has any questions or concerns regarding our treatment plan. No barriers to understanding were identified. We discussed Red Flag symptoms and signs in detail. Patient expressed understanding regarding what to do in case of urgent or emergency type symptoms.  Medication list was reconciled, printed and provided to the patient in AVS. Patient instructions and summary information was reviewed with the patient as documented in the AVS. This note was prepared with assistance of Dragon voice recognition software. Occasional wrong-word or sound-a-like substitutions may have occurred due to the inherent limitations of voice recognition  software

## 2022-05-02 ENCOUNTER — Other Ambulatory Visit: Payer: Self-pay

## 2022-05-16 DIAGNOSIS — L700 Acne vulgaris: Secondary | ICD-10-CM | POA: Diagnosis not present

## 2022-06-08 ENCOUNTER — Encounter: Payer: Self-pay | Admitting: Adult Health

## 2022-07-06 ENCOUNTER — Encounter: Payer: 59 | Admitting: Primary Care

## 2022-07-07 NOTE — Progress Notes (Signed)
Patient never seen °

## 2022-07-16 ENCOUNTER — Other Ambulatory Visit: Payer: Self-pay | Admitting: Primary Care

## 2022-07-16 DIAGNOSIS — E559 Vitamin D deficiency, unspecified: Secondary | ICD-10-CM

## 2022-07-16 DIAGNOSIS — E538 Deficiency of other specified B group vitamins: Secondary | ICD-10-CM

## 2022-07-16 NOTE — Telephone Encounter (Signed)
Please call patient:  We need to recheck her vitamin D level. Has she been taking vitamin D 50,000 IU once weekly?  She can have the lab drawn at her office, I can place a future order.

## 2022-07-17 NOTE — Telephone Encounter (Signed)
Unable to reach patient. Left voicemail to return call to our office.   

## 2022-07-17 NOTE — Telephone Encounter (Signed)
Patient returned call .She said that yes she is taking the vitamin D once weekly,she took her last pill yesterday. 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 She wanted to know does she still need to take her b12 medication?She took all that she had,so if so,she would need a refill sen tin for her.Please advise.

## 2022-07-17 NOTE — Telephone Encounter (Signed)
I will add a B12 lab to her vitamin D lab as well.  I'll be in touch again once we have results.

## 2022-07-25 ENCOUNTER — Other Ambulatory Visit: Payer: Self-pay | Admitting: Primary Care

## 2022-07-25 ENCOUNTER — Other Ambulatory Visit (INDEPENDENT_AMBULATORY_CARE_PROVIDER_SITE_OTHER): Payer: 59

## 2022-07-25 DIAGNOSIS — E559 Vitamin D deficiency, unspecified: Secondary | ICD-10-CM | POA: Diagnosis not present

## 2022-07-25 DIAGNOSIS — E538 Deficiency of other specified B group vitamins: Secondary | ICD-10-CM | POA: Diagnosis not present

## 2022-07-25 LAB — VITAMIN B12: Vitamin B-12: 157 pg/mL — ABNORMAL LOW (ref 211–911)

## 2022-07-25 LAB — VITAMIN D 25 HYDROXY (VIT D DEFICIENCY, FRACTURES): VITD: 22.65 ng/mL — ABNORMAL LOW (ref 30.00–100.00)

## 2022-07-25 MED ORDER — VITAMIN D (ERGOCALCIFEROL) 1.25 MG (50000 UNIT) PO CAPS: ORAL_CAPSULE | ORAL | 0 refills | Status: DC

## 2022-08-01 ENCOUNTER — Other Ambulatory Visit: Payer: Self-pay

## 2022-08-01 MED ORDER — METRONIDAZOLE 0.75 % VA GEL: 1.0000 | Freq: Two times a day (BID) | VAGINAL | 0 refills | Status: DC

## 2022-08-02 ENCOUNTER — Encounter: Payer: Self-pay | Admitting: Primary Care

## 2022-08-02 ENCOUNTER — Other Ambulatory Visit: Payer: Self-pay

## 2022-08-02 ENCOUNTER — Telehealth: Payer: 59 | Admitting: Primary Care

## 2022-08-02 DIAGNOSIS — N898 Other specified noninflammatory disorders of vagina: Secondary | ICD-10-CM

## 2022-08-02 DIAGNOSIS — M79604 Pain in right leg: Secondary | ICD-10-CM | POA: Diagnosis not present

## 2022-08-02 DIAGNOSIS — M545 Low back pain, unspecified: Secondary | ICD-10-CM | POA: Diagnosis not present

## 2022-08-02 DIAGNOSIS — R7303 Prediabetes: Secondary | ICD-10-CM | POA: Diagnosis not present

## 2022-08-02 HISTORY — DX: Other specified noninflammatory disorders of vagina: N89.8

## 2022-08-02 MED ORDER — PREDNISONE 20 MG PO TABS: ORAL_TABLET | ORAL | 0 refills | Status: DC

## 2022-08-02 NOTE — Progress Notes (Signed)
Patient ID: Faith Roth, female    DOB: 07-Dec-1970, 52 y.o.   MRN: [id number]  Virtual visit completed through [employer], a video enabled telemedicine application. Due to national recommendations of social distancing due to COVID-19, a virtual visit is felt to be most appropriate for this patient at this time. Reviewed limitations, risks, security and privacy concerns of performing a virtual visit and the availability of in person appointments. I also reviewed that there may be a patient responsible charge related to this service. The patient agreed to proceed.   Patient location: home Provider location: [place] at [facility], office Persons participating in this virtual visit: patient, provider   If any vitals were documented, they were collected by patient at home unless specified below.    There were no vitals taken for this visit.   CC: Multiple Concerns Subjective:   HPI: Faith Roth is a 52 y.o. female presenting on 08/02/2022 for Leg Pain (Right leg pain that radiates down into the back of calf for a couple weeks. Feels like a numbness and tingling sensation ) and Vaginal Discharge (X3 days clear vaginal discharge, lower back pain and slight lower abdominal pain/Denies vaginal itching, foul smell, dysuria, hematuria)  1) Lower Extremity Pain: Over the last two weeks she's noticed right posterior thigh pain with radiation to her posterior right knee. Her pain is intermittent, mostly noticeable when sitting, walking, driving. She's also noticed intermittent numbness which can move down to her feet. She will often have to adjust her chair or car seat to reduce her pain/numbness.   She denies changes in her daily routine, injury/trauma, loss of bowel or bladder control. She has a prior history of right lower extremity pain with lower back pain.   2) Vaginal Discharge: Acute for the last three days. Her discharge is a clear/whitish color. She denies vaginal itching, foul  smell, dysuria, hematuria. She was provided with a prescription for Metronidazole Gel by another provider but has yet to start.   She does have a history of bacterial vaginosis, last tested in November 2023.       Relevant past medical, surgical, family and social history reviewed and updated as indicated. Interim medical history since our last visit reviewed. Allergies and medications reviewed and updated. Outpatient Medications Prior to Visit  Medication Sig Dispense Refill   clobetasol cream (TEMOVATE) 0.05 % APPLY TOPICALLY 2 TIMES DAILY AS NEEDED. 30 g 0   cyclobenzaprine (FLEXERIL) 10 MG tablet Take 1 tablet (10 mg total) by mouth at bedtime as needed for muscle spasms. 30 tablet 0   diclofenac (VOLTAREN) 75 MG EC tablet Take 1 tablet (75 mg total) by mouth 2 (two) times daily. 180 tablet 0   Iron, Ferrous Sulfate, 325 (65 Fe) MG TABS Take 325 mg by mouth daily. For iron. 90 tablet 1   tretinoin (RETIN-A) 0.025 % cream Apply topically at bedtime. 45 g 0   triamcinolone cream (KENALOG) 0.1 % APPLY 1 APPLICATION TOPICALLY TWICE A DAY AS NEEDED 30 g 0   valACYclovir (VALTREX) 500 MG tablet Take 1 tablet (500 mg total) by mouth daily. For outbreak prevention. 90 tablet 0   venlafaxine XR (EFFEXOR XR) 37.5 MG 24 hr capsule Take 1 capsule (37.5 mg total) by mouth daily with breakfast. For hot flashes. 90 capsule 0   Vitamin D, Ergocalciferol, (DRISDOL) 1.25 MG (50000 UNIT) CAPS capsule Take 1 capsule by mouth once weekly for 12 weeks. 12 capsule 0   metroNIDAZOLE (METROGEL) 0.75 %  vaginal gel Place 1 Applicatorful vaginally 2 (two) times daily. (Patient not taking: Reported on 08/02/2022) 70 g 0   traMADol (ULTRAM) 50 MG tablet Take 1 tablet (50 mg total) by mouth 3 (three) times daily as needed. 15 tablet 0   No facility-administered medications prior to visit.     Per HPI unless specifically indicated in ROS section below Review of Systems  Genitourinary:  Positive for vaginal discharge.  Negative for dysuria and frequency.  Musculoskeletal:  Positive for back pain.  Skin:  Negative for color change.  Neurological:  Positive for numbness. Negative for weakness.   Objective:  There were no vitals taken for this visit.  Wt Readings from Last 3 Encounters:  04/18/22 169 lb 12.8 oz (77 kg)  03/15/22 171 lb (77.6 kg)  03/09/22 173 lb (78.5 kg)       Physical exam: General: Alert and oriented x 3, no distress, does not appear sickly  Pulmonary: Speaks in complete sentences without increased work of breathing, no cough during visit.  Psychiatric: Normal mood, thought content, and behavior.     Results for orders placed or performed in visit on 07/25/22  Vitamin B12  Result Value Ref Range   Vitamin B-12 157 (L) 211 - 911 pg/mL  VITAMIN D 25 Hydroxy (Vit-D Deficiency, Fractures)  Result Value Ref Range   VITD 22.65 (L) 30.00 - 100.00 ng/mL   Assessment & Plan:   Problem List Items Addressed This Visit       Other   Prediabetes   Relevant Orders   Hemoglobin A1c   Low back pain radiating to right leg - Primary    Symptoms suggestive of sciatica. No alarm signs.  Start prednisone taper with 60 mg x 3 days, then 40 mg x 3 days, then 20 mg x 3 days. Discussed use of cyclobenzaprine 10 mg PRN.  Stretching, ice/heat.       Relevant Medications   predniSONE (DELTASONE) 20 MG tablet   Vaginal discharge    Could be bacterial vaginosis, she does have a prior history. Wet prep ordered and pending. She will collect at her lab and send off.  Start Metronidazole Gel if needed. Await lab results.       Relevant Orders   WET PREP BY MOLECULAR PROBE     Meds ordered this encounter  Medications   predniSONE (DELTASONE) 20 MG tablet    Sig: Take 3 tablets by mouth every morning for three days, then 2 tablets for three days, then 1 tablet for three days.    Dispense:  18 tablet    Refill:  0    Order Specific Question:   Supervising Provider    Answer:    BEDSOLE, AMY E [[id number]]   Orders Placed This Encounter  Procedures   WET PREP BY MOLECULAR PROBE    Standing Status:   Future    Standing Expiration Date:   08/02/2023   Hemoglobin A1c    Standing Status:   Future    Standing Expiration Date:   08/02/2023    I discussed the assessment and treatment plan with the patient. The patient was provided an opportunity to ask questions and all were answered. The patient agreed with the plan and demonstrated an understanding of the instructions. The patient was advised to call back or seek an in-person evaluation if the symptoms worsen or if the condition fails to improve as anticipated.  Follow up plan:  Start prednisone tablets as discussed. Take 3 tablets  every morning for 3 days, then 2 tablets for 3 days, then 1 tablet for 3 days.  Use the cyclobenzaprine muscle relaxer as needed. This may cause drowsiness.  Collect the Wet Prep lab and send it off as discussed.  Try stretching your back and leg.   It was a pleasure to see you today!  Doreene Nest, NP

## 2022-08-02 NOTE — Assessment & Plan Note (Signed)
Symptoms suggestive of sciatica. No alarm signs.  Start prednisone taper with 60 mg x 3 days, then 40 mg x 3 days, then 20 mg x 3 days. Discussed use of cyclobenzaprine 10 mg PRN.  Stretching, ice/heat.

## 2022-08-02 NOTE — Assessment & Plan Note (Signed)
Could be bacterial vaginosis, she does have a prior history. Wet prep ordered and pending. She will collect at her lab and send off.  Start Metronidazole Gel if needed. Await lab results.

## 2022-08-02 NOTE — Patient Instructions (Addendum)
Start prednisone tablets as discussed. Take 3 tablets every morning for 3 days, then 2 tablets for 3 days, then 1 tablet for 3 days.  Use the cyclobenzaprine muscle relaxer as needed. This may cause drowsiness.  Collect the Wet Prep lab and send it off as discussed.  Try stretching your back and leg.   It was a pleasure to see you today!

## 2022-08-04 ENCOUNTER — Other Ambulatory Visit: Payer: Self-pay

## 2022-08-04 DIAGNOSIS — M545 Low back pain, unspecified: Secondary | ICD-10-CM

## 2022-08-04 DIAGNOSIS — N898 Other specified noninflammatory disorders of vagina: Secondary | ICD-10-CM

## 2022-08-04 LAB — TIQ-NTM

## 2022-08-05 LAB — URINE CULTURE
MICRO NUMBER:: 14941283
SPECIMEN QUALITY:: ADEQUATE

## 2022-08-08 LAB — WET PREP BY MOLECULAR PROBE

## 2022-08-08 LAB — SURESWAB® ADVANCED CANDIDA VAGINITIS (CV), TMA
CANDIDA SPECIES: NOT DETECTED
Candida glabrata: NOT DETECTED

## 2022-08-08 LAB — TEST AUTHORIZATION 2

## 2022-08-29 ENCOUNTER — Other Ambulatory Visit: Payer: Self-pay | Admitting: Primary Care

## 2022-08-29 DIAGNOSIS — L309 Dermatitis, unspecified: Secondary | ICD-10-CM

## 2022-09-27 ENCOUNTER — Telehealth (INDEPENDENT_AMBULATORY_CARE_PROVIDER_SITE_OTHER): Payer: 59 | Admitting: Family

## 2022-09-27 VITALS — Temp 102.0°F | Ht 64.0 in

## 2022-09-27 DIAGNOSIS — U071 COVID-19: Secondary | ICD-10-CM

## 2022-09-27 MED ORDER — HYDROMET 5-1.5 MG/5ML PO SOLN
5.0000 mL | Freq: Four times a day (QID) | ORAL | 0 refills | Status: DC | PRN
Start: 2022-09-27 — End: 2023-03-23

## 2022-09-27 MED ORDER — NIRMATRELVIR/RITONAVIR (PAXLOVID)TABLET
3.0000 | ORAL_TABLET | Freq: Two times a day (BID) | ORAL | 0 refills | Status: AC
Start: 2022-09-27 — End: 2022-10-02

## 2022-09-27 NOTE — Progress Notes (Signed)
MyChart Video Visit    Virtual Visit via Video Note   This format is felt to be most appropriate for this patient at this time. Physical exam was limited by quality of the video and audio technology used for the visit. CMA was able to get the patient set up on a video visit.  Patient location: Home. Patient and provider in visit Provider location: Office  I discussed the limitations of evaluation and management by telemedicine and the availability of in person appointments. The patient expressed understanding and agreed to proceed.  Visit Date: 09/27/2022  Today's healthcare provider: Dulce Sellar, NP     Subjective:   Patient ID: Faith Roth, female    DOB: [birth year], 52 y.o.   MRN: [id number]  Chief Complaint  Patient presents with   Covid Positive    Pt tested positive for Covid, Sx headache, fever of 102.0, fatigue, body aches, cough and chills. Sx started Monday, Has tried Dayquil and half prednisone last night. Second time with covid.     HPI Covid virus:  pt was out of town and flew home and started feeling bad as soon as she got home. Sx for 3 days, fever up to 102, coughing, body aches, chills, fatigue and no appetite, denies nausea or vomiting.   Assessment & Plan:  COVID-19- Sending Paxlovid, pt advised of FDA label approval for use, how to take, & SE. Advised of CDC guidelines for masking if out in public. OK to continue taking OTC sinus or pain meds. Take 1-2 generic Aleve bid or 3 Ibuprofen tid for body aches, fever. Also sending cough syrup, advised on precautions, use. Encouraged to monitor & notify office of any worsening symptoms: increased shortness of breath, weakness, and signs of dehydration. Instructed to rest and hydrate well.   -     nirmatrelvir/ritonavir; Take 3 tablets by mouth 2 (two) times daily for 5 days. (Take nirmatrelvir 150 mg two tablets twice daily for 5 days and ritonavir 100 mg one tablet twice daily for 5 days) Patient  GFR is 88.  Dispense: 30 tablet; Refill: 0   Past Medical History:  Diagnosis Date   Allergy    Chicken pox    Depression    Migraines    UTI (urinary tract infection)     Past Surgical History:  Procedure Laterality Date   ABDOMINOPLASTY     CESAREAN SECTION  12/04/2004    Outpatient Medications Prior to Visit  Medication Sig Dispense Refill   clindamycin (CLEOCIN T) 1 % external solution Apply topically 2 (two) times daily.     clobetasol cream (TEMOVATE) 0.05 % APPLY TOPICALLY 2 TIMES DAILY AS NEEDED. 30 g 0   Cyanocobalamin (B-12 PO) Take by mouth.     desonide (DESOWEN) 0.05 % cream Apply topically 2 (two) times daily.     diclofenac (VOLTAREN) 75 MG EC tablet Take 75 mg by mouth 2 (two) times daily.     hydroquinone 4 % cream Apply topically.     Iron, Ferrous Sulfate, 325 (65 Fe) MG TABS Take 325 mg by mouth daily. For iron. 90 tablet 1   tretinoin (RETIN-A) 0.025 % cream Apply topically at bedtime. 45 g 0   triamcinolone cream (KENALOG) 0.1 % APPLY 1 APPLICATION TOPICALLY TWICE A DAY AS NEEDED 30 g 0   valACYclovir (VALTREX) 500 MG tablet Take 1 tablet (500 mg total) by mouth daily. For outbreak prevention. 90 tablet 0   venlafaxine XR (EFFEXOR XR) 37.5 MG  24 hr capsule Take 1 capsule (37.5 mg total) by mouth daily with breakfast. For hot flashes. 90 capsule 0   Vitamin D, Ergocalciferol, (DRISDOL) 1.25 MG (50000 UNIT) CAPS capsule Take 1 capsule by mouth once weekly for 12 weeks. 12 capsule 0   cyclobenzaprine (FLEXERIL) 10 MG tablet Take 1 tablet (10 mg total) by mouth at bedtime as needed for muscle spasms. 30 tablet 0   No facility-administered medications prior to visit.    Allergies  Allergen Reactions   Propofol Other (See Comments)    Wouldn't wake up from anesthesia Pt states, "couldn't be awaken from surgery."    Doxycycline Swelling       Objective:   Physical Exam Vitals and nursing note reviewed.  Constitutional:      General: Pt is in acute  distress, lying bed, throat very hoarse.    Appearance: Ill appearing.  HENT:     Head: Normocephalic.  Pulmonary:     Effort: No respiratory distress.  Musculoskeletal:     Cervical back: Normal range of motion.  Skin:    General: Skin is dry.     Coloration: Skin is not pale.  Neurological:     Mental Status: Pt is alert but tired, and oriented to person, place, and time.  Psychiatric:        Mood and Affect: Mood normal.   Temp (!) 102 F (38.9 C) (Temporal)   Ht 5\' 4"  (1.626 m)   BMI 29.15 kg/m   Wt Readings from Last 3 Encounters:  04/18/22 169 lb 12.8 oz (77 kg)  03/15/22 171 lb (77.6 kg)  03/09/22 173 lb (78.5 kg)       I discussed the assessment and treatment plan with the patient. The patient was provided an opportunity to ask questions and all were answered. The patient agreed with the plan and demonstrated an understanding of the instructions.   The patient was advised to call back or seek an in-person evaluation if the symptoms worsen or if the condition fails to improve as anticipated.  Dulce Sellar, NP [facility] [phone] (phone) [phone] (fax)  [facility]

## 2022-10-13 ENCOUNTER — Telehealth: Payer: Self-pay

## 2022-10-13 DIAGNOSIS — R053 Chronic cough: Secondary | ICD-10-CM

## 2022-10-13 NOTE — Telephone Encounter (Signed)
Pt c/o lingering cough from Covid, Pt would like a RX sent in for Tessalon perles for cough.

## 2022-10-15 MED ORDER — BENZONATATE 200 MG PO CAPS
200.0000 mg | ORAL_CAPSULE | Freq: Three times a day (TID) | ORAL | 0 refills | Status: AC | PRN
Start: 2022-10-15 — End: 2022-10-25

## 2022-10-15 NOTE — Addendum Note (Signed)
Addended byDulce Sellar on: 10/15/2022 07:44 AM   Modules accepted: Orders

## 2022-10-17 ENCOUNTER — Other Ambulatory Visit: Payer: Self-pay | Admitting: Primary Care

## 2022-10-17 DIAGNOSIS — E559 Vitamin D deficiency, unspecified: Secondary | ICD-10-CM

## 2022-10-17 NOTE — Telephone Encounter (Signed)
Please call patient:  She needs vitamin D level redrawn. I've also pended her A1C and B12.

## 2022-10-18 NOTE — Telephone Encounter (Signed)
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

## 2022-11-08 DIAGNOSIS — Z1231 Encounter for screening mammogram for malignant neoplasm of breast: Secondary | ICD-10-CM

## 2022-11-13 ENCOUNTER — Encounter: Payer: Self-pay | Admitting: *Deleted

## 2022-11-13 ENCOUNTER — Other Ambulatory Visit: Payer: Self-pay | Admitting: Primary Care

## 2022-11-13 ENCOUNTER — Encounter: Payer: Self-pay | Admitting: Adult Health

## 2022-11-13 DIAGNOSIS — L700 Acne vulgaris: Secondary | ICD-10-CM

## 2022-11-13 MED ORDER — TRETINOIN 0.025 % EX CREA
TOPICAL_CREAM | Freq: Every day | CUTANEOUS | 0 refills | Status: DC
Start: 2022-11-13 — End: 2023-05-29

## 2022-11-13 NOTE — Telephone Encounter (Signed)
Patient request refill sent to [facility]

## 2022-11-24 ENCOUNTER — Other Ambulatory Visit: Payer: Self-pay

## 2022-11-24 MED ORDER — DICLOFENAC SODIUM 75 MG PO TBEC: 75.0000 mg | DELAYED_RELEASE_TABLET | Freq: Two times a day (BID) | ORAL | 3 refills | Status: DC

## 2022-12-04 ENCOUNTER — Other Ambulatory Visit (INDEPENDENT_AMBULATORY_CARE_PROVIDER_SITE_OTHER): Payer: 59

## 2022-12-04 DIAGNOSIS — E538 Deficiency of other specified B group vitamins: Secondary | ICD-10-CM | POA: Diagnosis not present

## 2022-12-04 DIAGNOSIS — E559 Vitamin D deficiency, unspecified: Secondary | ICD-10-CM

## 2022-12-04 DIAGNOSIS — R7303 Prediabetes: Secondary | ICD-10-CM

## 2022-12-04 LAB — VITAMIN B12: Vitamin B-12: 177 pg/mL — ABNORMAL LOW (ref 211–911)

## 2022-12-04 LAB — VITAMIN D 25 HYDROXY (VIT D DEFICIENCY, FRACTURES): VITD: 26.21 ng/mL — ABNORMAL LOW (ref 30.00–100.00)

## 2022-12-04 LAB — HEMOGLOBIN A1C: Hgb A1c MFr Bld: 6.2 % (ref 4.6–6.5)

## 2022-12-18 ENCOUNTER — Ambulatory Visit
Admission: RE | Admit: 2022-12-18 | Discharge: 2022-12-18 | Disposition: A | Discharge status: Home / Self Care | Payer: 59 | Source: Ambulatory Visit | Attending: Primary Care

## 2022-12-18 DIAGNOSIS — Z1231 Encounter for screening mammogram for malignant neoplasm of breast: Secondary | ICD-10-CM | POA: Diagnosis not present

## 2023-03-16 ENCOUNTER — Encounter: Payer: Self-pay | Admitting: Family Medicine

## 2023-03-16 ENCOUNTER — Other Ambulatory Visit: Payer: Self-pay | Admitting: Family Medicine

## 2023-03-16 DIAGNOSIS — N898 Other specified noninflammatory disorders of vagina: Secondary | ICD-10-CM

## 2023-03-16 MED ORDER — METRONIDAZOLE 500 MG PO TABS: 500.0000 mg | ORAL_TABLET | Freq: Two times a day (BID) | ORAL | 0 refills | Status: DC

## 2023-03-16 NOTE — Progress Notes (Signed)
Pt c/o vaginal discharge with odor; no itching. No risk of stds. No bleeding.   Spoke with pt.  Will treat for BV; has had similar presentation before.

## 2023-03-22 ENCOUNTER — Ambulatory Visit (INDEPENDENT_AMBULATORY_CARE_PROVIDER_SITE_OTHER): Payer: 59 | Admitting: Primary Care

## 2023-03-22 ENCOUNTER — Encounter: Payer: Self-pay | Admitting: Primary Care

## 2023-03-22 ENCOUNTER — Other Ambulatory Visit (HOSPITAL_COMMUNITY)
Admission: RE | Admit: 2023-03-22 | Discharge: 2023-03-22 | Disposition: A | Discharge status: Home / Self Care | Payer: 59 | Source: Ambulatory Visit | Attending: Primary Care | Admitting: Primary Care

## 2023-03-22 VITALS — BP 136/86 | HR 90 | Temp 97.8°F | Ht 64.0 in | Wt 168.0 lb

## 2023-03-22 DIAGNOSIS — D509 Iron deficiency anemia, unspecified: Secondary | ICD-10-CM

## 2023-03-22 DIAGNOSIS — Z124 Encounter for screening for malignant neoplasm of cervix: Secondary | ICD-10-CM | POA: Insufficient documentation

## 2023-03-22 DIAGNOSIS — M5441 Lumbago with sciatica, right side: Secondary | ICD-10-CM | POA: Diagnosis not present

## 2023-03-22 DIAGNOSIS — N939 Abnormal uterine and vaginal bleeding, unspecified: Secondary | ICD-10-CM | POA: Diagnosis not present

## 2023-03-22 DIAGNOSIS — L309 Dermatitis, unspecified: Secondary | ICD-10-CM | POA: Diagnosis not present

## 2023-03-22 DIAGNOSIS — R7303 Prediabetes: Secondary | ICD-10-CM

## 2023-03-22 DIAGNOSIS — Z8619 Personal history of other infectious and parasitic diseases: Secondary | ICD-10-CM

## 2023-03-22 DIAGNOSIS — N921 Excessive and frequent menstruation with irregular cycle: Secondary | ICD-10-CM | POA: Diagnosis not present

## 2023-03-22 DIAGNOSIS — R232 Flushing: Secondary | ICD-10-CM | POA: Insufficient documentation

## 2023-03-22 DIAGNOSIS — Z9889 Other specified postprocedural states: Secondary | ICD-10-CM | POA: Diagnosis not present

## 2023-03-22 DIAGNOSIS — Z0001 Encounter for general adult medical examination with abnormal findings: Secondary | ICD-10-CM | POA: Diagnosis not present

## 2023-03-22 DIAGNOSIS — G8929 Other chronic pain: Secondary | ICD-10-CM

## 2023-03-22 DIAGNOSIS — M5412 Radiculopathy, cervical region: Secondary | ICD-10-CM | POA: Diagnosis not present

## 2023-03-22 DIAGNOSIS — Z1211 Encounter for screening for malignant neoplasm of colon: Secondary | ICD-10-CM

## 2023-03-22 MED ORDER — VALACYCLOVIR HCL 1 G PO TABS
ORAL_TABLET | ORAL | 0 refills | Status: DC
Start: 2023-03-22 — End: 2023-05-31

## 2023-03-22 NOTE — Assessment & Plan Note (Signed)
Significant.  Could be perimenopausal.  Iron studies and CBC pending.  Orders placed for pelvic/transvaginal US placed.

## 2023-03-22 NOTE — Assessment & Plan Note (Signed)
No recent iron supplementation. She continues with menorrhagia and irregular menses.   Repeat iron studies pending.

## 2023-03-22 NOTE — Assessment & Plan Note (Signed)
Now following with plastic surgeon in [place]. Pending corrective surgery.

## 2023-03-22 NOTE — Assessment & Plan Note (Signed)
Chronic and continued.   Continue clobetasol 0.05% cream PRN. Also follows with dermatology.

## 2023-03-22 NOTE — Progress Notes (Signed)
Subjective:    Patient ID: Faith Roth, female    DOB: June 08, 1970, 52 y.o.   MRN: [id number]  HPI  Faith Roth is a very pleasant 52 y.o. female who presents today for complete physical and follow up of chronic conditions.  She continues to experience abnormal uterine bleeding with irregular menstrual cycles. Menses is so heavy that she will have to use a super heavy tampon every 30 minutes. She underwent pelvic/transvaginal US in 2022 which showed leiomyomata.   She continues to experience chronic neck pain. Also now with progressing bilateral upper extremity weakness, numbness to the left first digit. She also experiences left forearm spasms which leave her left hand contracted. She continues with bilateral upper extremity pain. She has not seen neurosurgery in several years.   Also with chronic history of bilateral lower back pain with intermittent right sided weakness. On occasion her right lower extremity will give out. She denies loss of bowel/bladder control. She does not see PT or a specialist. She is managed on diclofenac 75 mg BID for which she takes as needed after moderate exertion, probably once every 3-4 weeks.   Immunizations: -Tetanus: Completed in 2019 -Influenza: Completed this season  -Shingles: Never completed. Declines.   Diet: Fair diet.  Exercise: No regular exercise.  Eye exam: Completed >1 year ago Dental exam: Completed > 1 year ago  Pap Smear: December 2021 Mammogram: September 2024  Colonoscopy: Due in 2023, did not complete in 2023.   BP Readings from Last 3 Encounters:  03/22/23 136/86  04/18/22 (!) 130/93  03/15/22 138/84       Review of Systems  Constitutional:  Negative for unexpected weight change.  HENT:  Negative for rhinorrhea.   Respiratory:  Negative for cough and shortness of breath.   Cardiovascular:  Negative for chest pain.  Gastrointestinal:  Negative for constipation and diarrhea.  Genitourinary:  Positive for  menstrual problem and vaginal bleeding. Negative for difficulty urinating.  Musculoskeletal:  Positive for arthralgias, myalgias and neck pain.  Skin:  Negative for rash.  Allergic/Immunologic: Negative for environmental allergies.  Neurological:  Positive for numbness. Negative for dizziness and headaches.  Psychiatric/Behavioral:  The patient is not nervous/anxious.          Past Medical History:  Diagnosis Date   Allergy    Chicken pox    Depression    Migraines    UTI (urinary tract infection)     Social History   Socioeconomic History   Marital status: Single    Spouse name: Not on file   Number of children: Not on file   Years of education: Not on file   Highest education level: Not on file  Occupational History   Not on file  Tobacco Use   Smoking status: Never   Smokeless tobacco: Never  Substance and Sexual Activity   Alcohol use: No   Drug use: No   Sexual activity: Yes    Birth control/protection: Pill  Other Topics Concern   Not on file  Social History Narrative   Not on file   Social Drivers of Health   Financial Resource Strain: Not on file  Food Insecurity: Not on file  Transportation Needs: Not on file  Physical Activity: Not on file  Stress: Not on file  Social Connections: Unknown (08/06/2021)   Received from [facility]   Social Network    Social Network: Not on file  Intimate Partner Violence: Unknown (06/29/2021)   [facility]   HITS    Physically Hurt: Not on file    Insult or Talk Down To: Not on file    Threaten Physical Harm: Not on file    Scream or Curse: Not on file    Past Surgical History:  Procedure Laterality Date   ABDOMINOPLASTY     CESAREAN SECTION  12/04/2004    Family History  Problem Relation Age of Onset   Hypertension Mother    Diabetes Mother    Heart attack Mother    Hyperlipidemia Mother    Kidney disease Mother    Thyroid disease Mother    Hypertension  Father    Diabetes Father    Arthritis Father    Diabetes Sister     Allergies  Allergen Reactions   Propofol Other (See Comments)    Wouldn't wake up from anesthesia Pt states, "couldn't be awaken from surgery."    Doxycycline Swelling    Current Outpatient Medications on File Prior to Visit  Medication Sig Dispense Refill   clindamycin (CLEOCIN T) 1 % external solution Apply topically 2 (two) times daily.     clobetasol cream (TEMOVATE) 0.05 % APPLY TOPICALLY 2 TIMES DAILY AS NEEDED. 30 g 0   desonide (DESOWEN) 0.05 % cream Apply topically 2 (two) times daily.     diclofenac (VOLTAREN) 75 MG EC tablet Take 1 tablet (75 mg total) by mouth 2 (two) times daily. 180 tablet 3   hydroquinone 4 % cream Apply topically.     tretinoin (RETIN-A) 0.025 % cream Apply topically at bedtime. 45 g 0   triamcinolone cream (KENALOG) 0.1 % APPLY 1 APPLICATION TOPICALLY TWICE A DAY AS NEEDED 30 g 0   Vitamin D, Ergocalciferol, (DRISDOL) 1.25 MG (50000 UNIT) CAPS capsule Take 1 capsule by mouth once weekly for 12 weeks. 12 capsule 0   Cyanocobalamin (B-12 PO) Take by mouth. (Patient not taking: Reported on 03/22/2023)     HYDROcodone bit-homatropine (HYDROMET) 5-1.5 MG/5ML syrup Take 5 mLs by mouth every 6 (six) hours as needed for cough. (Patient not taking: Reported on 03/22/2023) 120 mL 0   Iron, Ferrous Sulfate, 325 (65 Fe) MG TABS Take 325 mg by mouth daily. For iron. (Patient not taking: Reported on 03/22/2023) 90 tablet 1   metroNIDAZOLE (FLAGYL) 500 MG tablet Take 1 tablet (500 mg total) by mouth 2 (two) times daily for 7 days. (Patient not taking: Reported on 03/22/2023) 14 tablet 0   venlafaxine XR (EFFEXOR XR) 37.5 MG 24 hr capsule Take 1 capsule (37.5 mg total) by mouth daily with breakfast. For hot flashes. (Patient not taking: Reported on 03/22/2023) 90 capsule 0   No current facility-administered medications on file prior to visit.    BP 136/86   Pulse 90   Temp 97.8 F (36.6 C)  (Temporal)   Ht 5\' 4"  (1.626 m)   Wt 168 lb (76.2 kg)   LMP 02/19/2023   SpO2 99%   BMI 28.84 kg/m  Objective:   Physical Exam Exam conducted with a chaperone present.  HENT:     Right Ear: Tympanic membrane and ear canal normal.     Left Ear: Tympanic membrane and ear canal normal.  Eyes:     Pupils: Pupils are equal, round, and reactive to light.  Cardiovascular:     Rate and Rhythm: Normal rate and regular rhythm.  Pulmonary:     Effort: Pulmonary effort is normal.     Breath sounds: Normal breath sounds.  Abdominal:     General: Bowel sounds are normal.     Palpations: Abdomen is soft.     Tenderness: There is no abdominal tenderness.  Genitourinary:    Labia:        Right: No rash, tenderness or lesion.        Left: No rash, tenderness or lesion.      Vagina: Normal.     Cervix: Normal.     Uterus: Normal.      Adnexa: Right adnexa normal and left adnexa normal.     Rectum: External hemorrhoid present.  Musculoskeletal:        General: Normal range of motion.     Cervical back: Neck supple.  Skin:    General: Skin is warm and dry.  Neurological:     Mental Status: She is alert and oriented to person, place, and time.     Cranial Nerves: No cranial nerve deficit.     Deep Tendon Reflexes:     Reflex Scores:      Patellar reflexes are 2+ on the right side and 2+ on the left side. Psychiatric:        Mood and Affect: Mood normal.           Assessment & Plan:  Encounter for annual general medical examination with abnormal findings in adult Assessment & Plan: Declines Shingrix vaccines. Pap smear due, completed today Mammogram UTD Colonoscopy due, referral placed to GI  Discussed the importance of a healthy diet and regular exercise in order for weight loss, and to reduce the risk of further co-morbidity.  Exam stable. Labs pending.  Follow up in 1 year for repeat physical.    Iron deficiency anemia, unspecified iron deficiency anemia  type Assessment & Plan: No recent iron supplementation. She continues with menorrhagia and irregular menses.   Repeat iron studies pending.  Orders: -     CBC -     IBC + Ferritin  Menorrhagia with irregular cycle Assessment & Plan: Significant.  Could be perimenopausal.  Iron studies and CBC pending.  Orders placed for pelvic/transvaginal US placed.  Orders: -     US PELVIC COMPLETE WITH TRANSVAGINAL; Future  History of cold sores Assessment & Plan: Infrequent outbreaks.  Continue Valtrex 2 g BID x 1 day PRN. Refill provided.   Orders: -     valACYclovir HCl; Take 2 tablets by mouth twice daily x 1 day as needed for outbreaks.  Dispense: 12 tablet; Refill: 0  Cervical radiculopathy Assessment & Plan: Uncontrolled with concerning symptoms.   Referral placed to neurosurgery.   Orders: -     Ambulatory referral to Neurosurgery  Eczema, unspecified type Assessment & Plan: Chronic and continued.   Continue clobetasol 0.05% cream PRN. Also follows with dermatology.   Chronic bilateral low back pain with right-sided sciatica Assessment & Plan: Chronic and stable.   Recommended further work up with physical therapy, maybe even MRI. She declines for now nut will update if things change. Continue diclofenac 75 mg PRN for which she uses sparingly   Abnormal vaginal bleeding Assessment & Plan: Continued.  Orders placed for pelvic/vaginal ultrasound.  Orders: -     US PELVIC COMPLETE WITH TRANSVAGINAL; Future  Prediabetes Assessment & Plan: Repeat A1c pending.  Orders: -     Hemoglobin A1c -     Comprehensive metabolic panel -     Lipid panel -     TSH  S/P abdominoplasty Assessment & Plan: Now following with plastic  Careers adviser in [place]. Pending corrective surgery.   Screening for colon cancer -     Ambulatory referral to Gastroenterology  Screening for cervical cancer -     Cytology - PAP  Hot flashes Assessment &  Plan: Controlled.  Continue venlafaxine ER 37.5 mg PRN         Doreene Nest, NP

## 2023-03-22 NOTE — Patient Instructions (Signed)
Stop by the lab prior to leaving today. I will notify you of your results once received.   You will either be contacted via phone regarding your referral to neurosurgery, or you may receive a letter on your MyChart portal from our referral team with instructions for scheduling an appointment. Please let us know if you have not been contacted by anyone within two weeks.  You will receive a phone call regarding the colonoscopy and the transvaginal/pelvic ultrasound.  It was a pleasure to see you today!

## 2023-03-22 NOTE — Assessment & Plan Note (Signed)
Chronic and stable.   Recommended further work up with physical therapy, maybe even MRI. She declines for now nut will update if things change. Continue diclofenac 75 mg PRN for which she uses sparingly

## 2023-03-22 NOTE — Assessment & Plan Note (Signed)
Controlled.  Continue venlafaxine ER 37.5 mg PRN

## 2023-03-22 NOTE — Assessment & Plan Note (Signed)
Declines Shingrix vaccines. Pap smear due, completed today Mammogram UTD Colonoscopy due, referral placed to GI  Discussed the importance of a healthy diet and regular exercise in order for weight loss, and to reduce the risk of further co-morbidity.  Exam stable. Labs pending.  Follow up in 1 year for repeat physical.

## 2023-03-22 NOTE — Assessment & Plan Note (Signed)
Uncontrolled with concerning symptoms.   Referral placed to neurosurgery.

## 2023-03-22 NOTE — Assessment & Plan Note (Signed)
Continued.  Orders placed for pelvic/vaginal ultrasound.

## 2023-03-22 NOTE — Assessment & Plan Note (Signed)
Infrequent outbreaks.  Continue Valtrex 2 g BID x 1 day PRN. Refill provided.

## 2023-03-22 NOTE — Assessment & Plan Note (Signed)
Repeat A1c pending. 

## 2023-03-23 ENCOUNTER — Other Ambulatory Visit: Payer: Self-pay | Admitting: Primary Care

## 2023-03-23 DIAGNOSIS — R7303 Prediabetes: Secondary | ICD-10-CM

## 2023-03-23 DIAGNOSIS — N921 Excessive and frequent menstruation with irregular cycle: Secondary | ICD-10-CM

## 2023-03-23 DIAGNOSIS — D509 Iron deficiency anemia, unspecified: Secondary | ICD-10-CM

## 2023-03-23 LAB — COMPREHENSIVE METABOLIC PANEL
ALT: 9 U/L (ref 0–35)
AST: 15 U/L (ref 0–37)
Albumin: 4.2 g/dL (ref 3.5–5.2)
Alkaline Phosphatase: 83 U/L (ref 39–117)
BUN: 12 mg/dL (ref 6–23)
CO2: 28 meq/L (ref 19–32)
Calcium: 9.2 mg/dL (ref 8.4–10.5)
Chloride: 104 meq/L (ref 96–112)
Creatinine, Ser: 0.77 mg/dL (ref 0.40–1.20)
GFR: 88.93 mL/min (ref >60.00)
Glucose, Bld: 124 mg/dL — ABNORMAL HIGH (ref 70–99)
Potassium: 3.5 meq/L (ref 3.5–5.1)
Sodium: 139 meq/L (ref 135–145)
Total Bilirubin: 0.3 mg/dL (ref 0.2–1.2)
Total Protein: 7.4 g/dL (ref 6.0–8.3)

## 2023-03-23 LAB — LIPID PANEL
Cholesterol: 189 mg/dL (ref 0–200)
HDL: 52 mg/dL (ref >39.00)
LDL Cholesterol: 109 mg/dL — ABNORMAL HIGH (ref 0–99)
NonHDL: 136.81
Total CHOL/HDL Ratio: 4
Triglycerides: 139 mg/dL (ref 0.0–149.0)
VLDL: 27.8 mg/dL (ref 0.0–40.0)

## 2023-03-23 LAB — CBC
HCT: 35.6 % — ABNORMAL LOW (ref 36.0–46.0)
Hemoglobin: 11.9 g/dL — ABNORMAL LOW (ref 12.0–15.0)
MCHC: 33.3 g/dL (ref 30.0–36.0)
MCV: 85.6 fL (ref 78.0–100.0)
Platelets: 459 10*3/uL — ABNORMAL HIGH (ref 150.0–400.0)
RBC: 4.15 Mil/uL (ref 3.87–5.11)
RDW: 13.3 % (ref 11.5–15.5)
WBC: 6.8 10*3/uL (ref 4.0–10.5)

## 2023-03-23 LAB — IBC + FERRITIN
Ferritin: 7.3 ng/mL — ABNORMAL LOW (ref 10.0–291.0)
Iron: 34 ug/dL — ABNORMAL LOW (ref 42–145)
Saturation Ratios: 7.4 % — ABNORMAL LOW (ref 20.0–50.0)
TIBC: 460.6 ug/dL — ABNORMAL HIGH (ref 250.0–450.0)
Transferrin: 329 mg/dL (ref 212.0–360.0)

## 2023-03-23 LAB — HEMOGLOBIN A1C: Hgb A1c MFr Bld: 6.4 % (ref 4.6–6.5)

## 2023-03-23 LAB — TSH: TSH: 1.18 u[IU]/mL (ref 0.35–5.50)

## 2023-03-26 LAB — CYTOLOGY - PAP
Adequacy: ABSENT
Comment: NEGATIVE
Diagnosis: NEGATIVE
High risk HPV: NEGATIVE

## 2023-03-27 ENCOUNTER — Other Ambulatory Visit: Payer: 59

## 2023-03-29 ENCOUNTER — Inpatient Hospital Stay: Admission: RE | Admit: 2023-03-29 | Payer: 59 | Source: Ambulatory Visit

## 2023-03-29 ENCOUNTER — Ambulatory Visit
Admission: RE | Admit: 2023-03-29 | Discharge: 2023-03-29 | Disposition: A | Discharge status: Home / Self Care | Payer: 59 | Source: Ambulatory Visit | Attending: Primary Care | Admitting: Primary Care

## 2023-03-29 DIAGNOSIS — N939 Abnormal uterine and vaginal bleeding, unspecified: Secondary | ICD-10-CM

## 2023-03-29 DIAGNOSIS — D259 Leiomyoma of uterus, unspecified: Secondary | ICD-10-CM | POA: Diagnosis not present

## 2023-03-29 DIAGNOSIS — N921 Excessive and frequent menstruation with irregular cycle: Secondary | ICD-10-CM

## 2023-04-04 ENCOUNTER — Telehealth: Payer: Self-pay

## 2023-04-04 ENCOUNTER — Telehealth: Payer: Self-pay | Admitting: Primary Care

## 2023-04-04 DIAGNOSIS — D509 Iron deficiency anemia, unspecified: Secondary | ICD-10-CM

## 2023-04-04 DIAGNOSIS — N921 Excessive and frequent menstruation with irregular cycle: Secondary | ICD-10-CM

## 2023-04-04 NOTE — Telephone Encounter (Signed)
 Please call patient:  Review my comments I sent to her regarding her recent pelvic/transvaginal ultrasound.  I spoke with my GYN colleague and to control her heavy bleeding she can try birth control/hormones.  Options include pills, Depo Provera shot, or IUD.  I can refer her to GYN if she prefers.   Let me know what she decides.

## 2023-04-04 NOTE — Telephone Encounter (Signed)
 Noted. Also happy to send her to gynecology for other options. Will await her decision.

## 2023-04-04 NOTE — Telephone Encounter (Signed)
 Unable to reach patient. Left voicemail to return call to our office.

## 2023-04-04 NOTE — Telephone Encounter (Signed)
 Copied from CRM [id number]. Topic: Clinical - Lab/Test Results >> Apr 04, 2023  9:22 AM Franky GRADE wrote: Reason for CRM: Patient received a call from Kelli regarding Ultrasound results, I did read providers comment and patient would like to think of her options. Primary concern is weight gain if she chooses the birth control.

## 2023-04-05 NOTE — Telephone Encounter (Signed)
 Called and spoke with patient, reviewed all information. Patient would like to know if weight gain is a common side effect of the oral birth control pills? Patient is not interested in IUD.

## 2023-04-06 MED ORDER — NORETHINDRONE ACETATE 5 MG PO TABS: 5.0000 mg | ORAL_TABLET | Freq: Every day | ORAL | 1 refills | Status: DC

## 2023-04-06 NOTE — Addendum Note (Signed)
 Addended by: Doreene Nest on: 04/06/2023 05:50 PM   Modules accepted: Orders

## 2023-04-06 NOTE — Telephone Encounter (Signed)
 Not really with the pills. It can be more common with Depo Provera injections.

## 2023-04-06 NOTE — Telephone Encounter (Signed)
 Patient would like to proceed with oral birth control pills. Would like sent to [facility] [address].

## 2023-04-06 NOTE — Telephone Encounter (Signed)
 Noted.   I am sending a prescription for progesterone only pills to her pharmacy.   She can start her pill anytime. Typically, pills are started on the first Sunday after the period starts, but since her cycles are irregular she can start on any Sunday.   It may take several months before cycles improve. Have her update us  if no improvement.  We can also have her go for iron  infusions given her low levels. Let me know if she is interested.

## 2023-04-09 NOTE — Telephone Encounter (Signed)
 Called and spoke to patient, reviewed all information Patient does not want to continue receiving iron  infusions, patient is currently not taking any oral iron , she would like to know if she should be sent in a prescription?  Patient would also like to know if she needs a prescription strength vitamin D  or B12 supplement. Patient states she is currently taking Vitamin D  1000IU and Vitamin B12 3000 mcg. Patient does not want to do B12 injections  Patient would like to know what supplements she should be taking and if any can be sent as a rx?

## 2023-04-09 NOTE — Telephone Encounter (Signed)
 She needs to start taking ferrous sulfate  325 mg one daily. You can find this over the counter at any drug store, [facility], [employer], etc.  I recommend she increase her vitamin D  to 2000 international units  daily. Recommend B12 1000 mcg daily. You can find this over the counter at any drug store, [employer], [employer], etc.  Recommend repeat iron  studies in 3 months. Lab only appt is fine.

## 2023-04-10 NOTE — Telephone Encounter (Signed)
 Unable to reach patient. Left voicemail to return call to our office.  Sending MyChart message.

## 2023-04-11 NOTE — Telephone Encounter (Signed)
 Called patient and reviewed all information. Patient verbalized understanding. Will call if any further questions.  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 Orders are in future.

## 2023-04-16 ENCOUNTER — Telehealth: Payer: Self-pay

## 2023-04-16 DIAGNOSIS — Z111 Encounter for screening for respiratory tuberculosis: Secondary | ICD-10-CM

## 2023-04-16 NOTE — Telephone Encounter (Signed)
Copied from CRM [id number]. Topic: Clinical - Request for Lab/Test Order >> Apr 16, 2023 12:36 PM Faith Roth wrote: Reason for CRM: Patient has requested for the provider to order a blood draw for a TB test screening. Please follow up with patient if necessary [phone]

## 2023-04-16 NOTE — Addendum Note (Signed)
Addended by: Doreene Nest on: 04/16/2023 04:00 PM   Modules accepted: Orders

## 2023-04-16 NOTE — Telephone Encounter (Signed)
Noted, orders placed. 

## 2023-04-18 ENCOUNTER — Other Ambulatory Visit: Payer: 59

## 2023-04-18 DIAGNOSIS — Z111 Encounter for screening for respiratory tuberculosis: Secondary | ICD-10-CM | POA: Diagnosis not present

## 2023-04-21 LAB — QUANTIFERON-TB GOLD PLUS
Mitogen-NIL: 6.34 [IU]/mL
NIL: 0.02 [IU]/mL
QuantiFERON-TB Gold Plus: NEGATIVE
TB1-NIL: 0.05 [IU]/mL
TB2-NIL: 0 [IU]/mL

## 2023-04-23 ENCOUNTER — Encounter: Payer: Self-pay | Admitting: Primary Care

## 2023-05-01 DIAGNOSIS — L700 Acne vulgaris: Secondary | ICD-10-CM | POA: Diagnosis not present

## 2023-05-29 ENCOUNTER — Other Ambulatory Visit: Payer: Self-pay | Admitting: Primary Care

## 2023-05-29 DIAGNOSIS — Z8619 Personal history of other infectious and parasitic diseases: Secondary | ICD-10-CM

## 2023-05-29 DIAGNOSIS — L309 Dermatitis, unspecified: Secondary | ICD-10-CM

## 2023-05-29 DIAGNOSIS — L700 Acne vulgaris: Secondary | ICD-10-CM

## 2023-05-29 MED ORDER — TRIAMCINOLONE ACETONIDE 0.1 % EX CREA: TOPICAL_CREAM | Freq: Two times a day (BID) | CUTANEOUS | 0 refills | Status: DC | PRN

## 2023-05-29 MED ORDER — TRETINOIN 0.025 % EX CREA: TOPICAL_CREAM | Freq: Every day | CUTANEOUS | 0 refills | Status: AC

## 2023-05-29 NOTE — Telephone Encounter (Signed)
 Please call patient:  Received refill request for Valtrex. We refilled this in December to account for 3 outbreaks. Is she having more outbreaks than usual?

## 2023-05-31 MED ORDER — VALACYCLOVIR HCL 1 G PO TABS: ORAL_TABLET | ORAL | 0 refills | Status: AC

## 2023-05-31 NOTE — Telephone Encounter (Signed)
 Noted.  Let her know that if she continues to experience frequent outbreaks then we need to try daily suppressive treatment for a 3 month time span.   I will send refills for now.

## 2023-05-31 NOTE — Addendum Note (Signed)
 Addended by: Doreene Nest on: 05/31/2023 02:10 PM   Modules accepted: Orders

## 2023-05-31 NOTE — Telephone Encounter (Signed)
 Called and spoke with patient, she states she is having a lot more breakouts. She has 3 pills left.  In the past month, she has had 3 episodes that she could remember. She takes 2 tablets twice a day for 1 day when she feels an outbreak coming on. She currently feels an outbreak coming on and is wanting to have more on hand.

## 2023-06-01 NOTE — Telephone Encounter (Signed)
 Called patient and reviewed all information. Patient verbalized understanding. Will call if any further questions.

## 2023-08-24 DIAGNOSIS — B009 Herpesviral infection, unspecified: Secondary | ICD-10-CM

## 2023-08-24 DIAGNOSIS — L309 Dermatitis, unspecified: Secondary | ICD-10-CM

## 2023-09-06 MED ORDER — VALACYCLOVIR HCL 500 MG PO TABS: 500.0000 mg | ORAL_TABLET | Freq: Every day | ORAL | 0 refills | Status: DC

## 2023-10-02 MED ORDER — TRIAMCINOLONE ACETONIDE 0.1 % EX CREA: TOPICAL_CREAM | Freq: Two times a day (BID) | CUTANEOUS | 0 refills | Status: DC | PRN

## 2023-11-22 DIAGNOSIS — Z1231 Encounter for screening mammogram for malignant neoplasm of breast: Secondary | ICD-10-CM

## 2023-12-03 ENCOUNTER — Other Ambulatory Visit (HOSPITAL_COMMUNITY)
Admission: RE | Admit: 2023-12-03 | Discharge: 2023-12-03 | Disposition: A | Discharge status: Home / Self Care | Source: Ambulatory Visit | Attending: Family Medicine | Admitting: Family Medicine

## 2023-12-03 ENCOUNTER — Ambulatory Visit (INDEPENDENT_AMBULATORY_CARE_PROVIDER_SITE_OTHER): Admitting: Family Medicine

## 2023-12-03 ENCOUNTER — Encounter: Payer: Self-pay | Admitting: Family Medicine

## 2023-12-03 VITALS — BP 142/72 | HR 96 | Temp 98.3°F | Ht 64.0 in | Wt 172.8 lb

## 2023-12-03 DIAGNOSIS — R102 Pelvic and perineal pain: Secondary | ICD-10-CM | POA: Diagnosis not present

## 2023-12-03 LAB — URINALYSIS, ROUTINE W REFLEX MICROSCOPIC
Bilirubin Urine: NEGATIVE
Hgb urine dipstick: NEGATIVE
Ketones, ur: NEGATIVE
Leukocytes,Ua: NEGATIVE
Nitrite: NEGATIVE
RBC / HPF: NONE SEEN
Specific Gravity, Urine: >=1.03 — AB (ref 1.000–1.030)
Urine Glucose: NEGATIVE
Urobilinogen, UA: 0.2 (ref 0.0–1.0)
pH: 6 (ref 5.0–8.0)

## 2023-12-03 NOTE — Progress Notes (Signed)
 Subjective  CC:  Chief Complaint  Patient presents with   Acute Visit    Pelvic pain    Same day acute visit; PCP not available. New pt to me. Chart reviewed.  HPI: Faith Roth is a 53 y.o. female who presents to the office today to address the problems listed above in the chief complaint. C/o 2-3 days of low back pain and low pelvic pressure w/o vaginal discharge, burning or pain. No sores. No true dysuria. Worries about possibilities of female infection or std. No f/c/s. No vaginal bleeding.    Assessment  1. Pelvic pressure in female      Plan  Pelvic pressure:  r/o UTI, BV, yeast, or std with vaginal swab and urine testing.   Follow up: prn Visit date not found  Orders Placed This Encounter  Procedures   Urinalysis, Routine w reflex microscopic   No orders of the defined types were placed in this encounter.     I reviewed the patients updated PMH, FH, and SocHx.    Patient Active Problem List   Diagnosis Date Noted   Menorrhagia with irregular cycle 03/22/2023   Hot flashes 03/22/2023   Vaginal discharge 08/02/2022   Chronic back pain 03/09/2022   Iron  deficiency anemia 08/26/2021   History of cold sores 01/07/2021   Abdominal wall swelling 10/29/2020   S/P abdominoplasty 10/01/2020   Abnormal vaginal bleeding 08/11/2020   Prediabetes 08/11/2020   Chronic pain of right upper extremity 06/17/2020   Anemia 06/17/2020   Chronic constipation 03/05/2020   Encounter for annual general medical examination with abnormal findings in adult 03/05/2020   Family history of thyroid  disease 03/05/2020   Eczema 03/05/2020   Cervical radiculopathy 02/24/2020   Chronic migraine without aura without status migrainosus, not intractable 02/24/2020   No outpatient medications have been marked as taking for the 12/03/23 encounter (Office Visit) with Jodie Lavern CROME, MD.    Allergies: Patient is allergic to propofol and doxycycline . Family History: Patient family  history includes Arthritis in her father; Diabetes in her father, mother, and sister; Heart attack in her mother; Hyperlipidemia in her mother; Hypertension in her father and mother; Kidney disease in her mother; Thyroid  disease in her mother. Social History:  Patient  reports that she has never smoked. She has never used smokeless tobacco. She reports that she does not drink alcohol and does not use drugs.  Review of Systems: Constitutional: Negative for fever malaise or anorexia Cardiovascular: negative for chest pain Respiratory: negative for SOB or persistent cough Gastrointestinal: negative for abdominal pain  Objective  Vitals: There were no vitals taken for this visit. General: no acute distress , A&Ox3 Appears well.  Benign lower abdomen.  Commons side effects, risks, benefits, and alternatives for medications and treatment plan prescribed today were discussed, and the patient expressed understanding of the given instructions. Patient is instructed to call or message via MyChart if he/she has any questions or concerns regarding our treatment plan. No barriers to understanding were identified. We discussed Red Flag symptoms and signs in detail. Patient expressed understanding regarding what to do in case of urgent or emergency type symptoms.  Medication list was reconciled, printed and provided to the patient in AVS. Patient instructions and summary information was reviewed with the patient as documented in the AVS. This note was prepared with assistance of Dragon voice recognition software. Occasional wrong-word or sound-a-like substitutions may have occurred due to the inherent limitations of voice recognition software

## 2023-12-04 ENCOUNTER — Ambulatory Visit: Payer: Self-pay | Admitting: Family Medicine

## 2023-12-04 LAB — URINE CYTOLOGY ANCILLARY ONLY
Chlamydia: NEGATIVE
Comment: NEGATIVE
Comment: NORMAL
Neisseria Gonorrhea: NEGATIVE

## 2023-12-04 NOTE — Progress Notes (Signed)
 See mychart note Dear Ms. Gautney, Your initial urine test does not show a urinary tract infection. We will await the results of the other tests.  Sincerely, Dr. Jodie

## 2023-12-06 LAB — CERVICOVAGINAL ANCILLARY ONLY
Bacterial Vaginitis (gardnerella): POSITIVE — AB
Candida Glabrata: NEGATIVE
Candida Vaginitis: NEGATIVE
Comment: NEGATIVE
Comment: NEGATIVE
Comment: NEGATIVE
Comment: NEGATIVE
Trichomonas: NEGATIVE

## 2023-12-07 ENCOUNTER — Other Ambulatory Visit: Payer: Self-pay | Admitting: Primary Care

## 2023-12-07 DIAGNOSIS — Z1231 Encounter for screening mammogram for malignant neoplasm of breast: Secondary | ICD-10-CM

## 2023-12-08 MED ORDER — METRONIDAZOLE 500 MG PO TABS
500.0000 mg | ORAL_TABLET | Freq: Two times a day (BID) | ORAL | 0 refills | Status: DC
Start: 2023-12-08 — End: 2024-01-09

## 2023-12-08 NOTE — Progress Notes (Signed)
 See mychart note Dear Ms. Nyle, Your test confirms BV as we discussed. I have sent in an antibiotic for you to take.  Sincerely, Dr. Jodie

## 2023-12-28 ENCOUNTER — Inpatient Hospital Stay: Admission: RE | Admit: 2023-12-28 | Payer: 59 | Source: Ambulatory Visit

## 2023-12-28 ENCOUNTER — Ambulatory Visit
Admission: RE | Admit: 2023-12-28 | Discharge: 2023-12-28 | Disposition: A | Discharge status: Home / Self Care | Source: Ambulatory Visit | Attending: Primary Care | Admitting: Primary Care

## 2023-12-28 DIAGNOSIS — Z1231 Encounter for screening mammogram for malignant neoplasm of breast: Secondary | ICD-10-CM

## 2023-12-29 DIAGNOSIS — L309 Dermatitis, unspecified: Secondary | ICD-10-CM

## 2023-12-31 MED ORDER — CLOBETASOL PROPIONATE 0.05 % EX CREA: TOPICAL_CREAM | Freq: Two times a day (BID) | CUTANEOUS | 0 refills | Status: DC | PRN

## 2024-01-02 ENCOUNTER — Ambulatory Visit: Payer: Self-pay | Admitting: Primary Care

## 2024-01-04 ENCOUNTER — Other Ambulatory Visit: Payer: Self-pay

## 2024-01-04 DIAGNOSIS — L309 Dermatitis, unspecified: Secondary | ICD-10-CM

## 2024-01-04 MED ORDER — TRIAMCINOLONE ACETONIDE 0.1 % EX CREA: TOPICAL_CREAM | Freq: Two times a day (BID) | CUTANEOUS | 0 refills | Status: AC | PRN

## 2024-01-09 ENCOUNTER — Other Ambulatory Visit: Payer: Self-pay | Admitting: Family Medicine

## 2024-01-09 MED ORDER — METRONIDAZOLE 0.75 % VA GEL: 1.0000 | Freq: Two times a day (BID) | VAGINAL | 0 refills | Status: AC

## 2024-02-04 ENCOUNTER — Other Ambulatory Visit: Payer: Self-pay | Admitting: Primary Care

## 2024-02-04 DIAGNOSIS — G8929 Other chronic pain: Secondary | ICD-10-CM

## 2024-02-04 DIAGNOSIS — M5412 Radiculopathy, cervical region: Secondary | ICD-10-CM

## 2024-02-04 MED ORDER — DICLOFENAC SODIUM 75 MG PO TBEC: 75.0000 mg | DELAYED_RELEASE_TABLET | Freq: Two times a day (BID) | ORAL | 3 refills | Status: AC | PRN

## 2024-02-04 NOTE — Telephone Encounter (Unsigned)
 Copied from CRM [id number]. Topic: Clinical - Medication Refill >> Feb 04, 2024  9:52 AM Alfonso ORN wrote: Medication: diclofenac  (VOLTAREN ) 75 MG EC tablet   Has the patient contacted their pharmacy? Yes  This is the patient's preferred pharmacy:  [facility] [address] [place] - [address] [place] [address] Phone: [phone] Fax: [phone]  Is this the correct pharmacy for this prescription? Yes If no, delete pharmacy and type the correct one.   Has the prescription been filled recently? No  Is the patient out of the medication? Yes  Has the patient been seen for an appointment in the last year OR does the patient have an upcoming appointment? Yes  Can we respond through MyChart? Yes

## 2024-03-25 ENCOUNTER — Encounter: Payer: Self-pay | Admitting: Primary Care

## 2024-03-25 ENCOUNTER — Encounter: Admitting: Primary Care

## 2024-03-25 ENCOUNTER — Ambulatory Visit: Admitting: Primary Care

## 2024-03-25 VITALS — BP 138/78 | HR 90 | Temp 97.9°F | Ht 64.0 in | Wt 175.0 lb

## 2024-03-25 DIAGNOSIS — R7303 Prediabetes: Secondary | ICD-10-CM

## 2024-03-25 DIAGNOSIS — R29898 Other symptoms and signs involving the musculoskeletal system: Secondary | ICD-10-CM | POA: Insufficient documentation

## 2024-03-25 DIAGNOSIS — E559 Vitamin D deficiency, unspecified: Secondary | ICD-10-CM | POA: Diagnosis not present

## 2024-03-25 DIAGNOSIS — Z0001 Encounter for general adult medical examination with abnormal findings: Secondary | ICD-10-CM

## 2024-03-25 DIAGNOSIS — Z1211 Encounter for screening for malignant neoplasm of colon: Secondary | ICD-10-CM

## 2024-03-25 DIAGNOSIS — L309 Dermatitis, unspecified: Secondary | ICD-10-CM | POA: Diagnosis not present

## 2024-03-25 DIAGNOSIS — Z8349 Family history of other endocrine, nutritional and metabolic diseases: Secondary | ICD-10-CM

## 2024-03-25 DIAGNOSIS — D509 Iron deficiency anemia, unspecified: Secondary | ICD-10-CM | POA: Diagnosis not present

## 2024-03-25 DIAGNOSIS — G43709 Chronic migraine without aura, not intractable, without status migrainosus: Secondary | ICD-10-CM

## 2024-03-25 DIAGNOSIS — N921 Excessive and frequent menstruation with irregular cycle: Secondary | ICD-10-CM

## 2024-03-25 DIAGNOSIS — R232 Flushing: Secondary | ICD-10-CM | POA: Diagnosis not present

## 2024-03-25 DIAGNOSIS — M5412 Radiculopathy, cervical region: Secondary | ICD-10-CM | POA: Diagnosis not present

## 2024-03-25 DIAGNOSIS — E538 Deficiency of other specified B group vitamins: Secondary | ICD-10-CM | POA: Diagnosis not present

## 2024-03-25 DIAGNOSIS — Z23 Encounter for immunization: Secondary | ICD-10-CM

## 2024-03-25 DIAGNOSIS — Z8619 Personal history of other infectious and parasitic diseases: Secondary | ICD-10-CM | POA: Diagnosis not present

## 2024-03-25 DIAGNOSIS — Z0000 Encounter for general adult medical examination without abnormal findings: Secondary | ICD-10-CM

## 2024-03-25 MED ORDER — ZOSTER VAC RECOMB ADJUVANTED 50 MCG/0.5ML IM SUSR: 0.5000 mL | Freq: Once | INTRAMUSCULAR | 1 refills | Status: AC

## 2024-03-25 MED ORDER — GABAPENTIN 100 MG PO CAPS: ORAL_CAPSULE | ORAL | 0 refills | Status: AC

## 2024-03-25 NOTE — Patient Instructions (Addendum)
 You will either be contacted via phone regarding your referral to GI and Neurosurgery, or you may receive a letter on your MyChart portal from our referral team with instructions for scheduling an appointment. Please let us  know if you have not been contacted by anyone within two weeks.  For cold sore outbreaks, take Valtrex  2 pills twice daily for 1 day.  For hot flashes and neck/arm pain start gabapentin .  Take 1 to 3 capsules by mouth at bedtime.  This may cause drowsiness.  Complete the shingles vaccines.  The second vaccine is due 2 to 6 months after the first.  It was a pleasure to see you today!

## 2024-03-25 NOTE — Assessment & Plan Note (Signed)
 Incorrect administration.  Discussed to take 2 pills of Valtrex  twice daily x 1 day for outbreaks.  She will update

## 2024-03-25 NOTE — Assessment & Plan Note (Signed)
 Not on supplementation Repeat B12 pending.

## 2024-03-25 NOTE — Assessment & Plan Note (Signed)
 No iron  supplement for quite sometime.  Repeat iron  studies pending.  Colonoscopy referral placed.

## 2024-03-25 NOTE — Assessment & Plan Note (Addendum)
 Uncontrolled.   Failed venlafaxine  at low doses.  Start gabapentin  100-300 mg HS. She will update.

## 2024-03-25 NOTE — Assessment & Plan Note (Signed)
 Controlled.  Following with dermatology. Continue clobetasol  0.05% cream PRN, triamcinolone  0.1% cream PRN

## 2024-03-25 NOTE — Assessment & Plan Note (Signed)
 Printed Shingrix vaccine prescription per patient request. Pap smear UTD. Mammogram UTD Colonoscopy overdue, referral placed to GI  Discussed the importance of a healthy diet and regular exercise in order for weight loss, and to reduce the risk of further co-morbidity.  Exam stable. Labs pending.  Follow up in 1 year for repeat physical.

## 2024-03-25 NOTE — Assessment & Plan Note (Signed)
Repeat TSH pending

## 2024-03-25 NOTE — Assessment & Plan Note (Signed)
 Resolved.  Iron  studies pending. Not currently on supplementation

## 2024-03-25 NOTE — Progress Notes (Signed)
 "  Subjective:    Patient ID: Faith Roth, female    DOB: 01-Apr-1970, 53 y.o.   MRN: [id number]  Faith Roth is a very pleasant 53 y.o. female who presents today for complete physical and follow up of chronic conditions.  She continues to experience left lateral neck and left upper extremity pain, also with multiple muscle spasms per day with most any movement. Referred to neurosurgery in December 2024 but never followed through with an appointment.   She continues to experience hot flashes despite venlafaxine  37.5 mg daily.  She discontinued venlafaxine  as it was ineffective.  Immunizations: -Tetanus: Completed in 2019 -Influenza: Completed this season  -Shingles: Never completed.   Diet: Fair diet.  Exercise: No regular exercise.  Eye exam: Completes annually  Dental exam: Completes semi-annually    Pap Smear: Completed in December 2024 Mammogram: Completed in October 2025  Colonoscopy: Due in 2023, has yet to complete.   BP Readings from Last 3 Encounters:  03/25/24 138/78  12/03/23 (!) 142/72  03/22/23 136/86        Review of Systems  Constitutional:  Negative for unexpected weight change.  HENT:  Negative for rhinorrhea.   Respiratory:  Negative for cough and shortness of breath.   Cardiovascular:  Negative for chest pain.  Gastrointestinal:  Negative for constipation and diarrhea.  Genitourinary:  Negative for difficulty urinating and menstrual problem.       Hot flashes  Musculoskeletal:  Positive for myalgias and neck pain.  Skin:  Negative for rash.  Allergic/Immunologic: Negative for environmental allergies.  Neurological:  Positive for numbness. Negative for dizziness and headaches.  Psychiatric/Behavioral:  The patient is not nervous/anxious.          Past Medical History:  Diagnosis Date   Abnormal vaginal bleeding 08/11/2020   Allergy    Chicken pox    Chronic pain of right upper extremity 06/17/2020   Depression    Migraines     UTI (urinary tract infection)    Vaginal discharge 08/02/2022    Social History   Socioeconomic History   Marital status: Single    Spouse name: Not on file   Number of children: Not on file   Years of education: Not on file   Highest education level: Not on file  Occupational History   Not on file  Tobacco Use   Smoking status: Never   Smokeless tobacco: Never  Substance and Sexual Activity   Alcohol use: No   Drug use: No   Sexual activity: Yes    Birth control/protection: Pill  Other Topics Concern   Not on file  Social History Narrative   Not on file   Social Drivers of Health   Tobacco Use: Low Risk (03/25/2024)   Patient History    Smoking Tobacco Use: Never    Smokeless Tobacco Use: Never    Passive Exposure: Not on file  Financial Resource Strain: Not on file  Food Insecurity: Not on file  Transportation Needs: Not on file  Physical Activity: Not on file  Stress: Not on file  Social Connections: Unknown (08/06/2021)   Received from [facility]   Social Network    Social Network: Not on file  Intimate Partner Violence: Unknown (06/29/2021)   Received from [facility]   HITS    Physically Hurt: Not on file    Insult or Talk Down To: Not on file    Threaten Physical Harm: Not on file    Scream or Curse:  Not on file  Depression (PHQ2-9): Low Risk (03/25/2024)   Depression (PHQ2-9)    PHQ-2 Score: 0  Alcohol Screen: Not on file  Housing: Not on file  Utilities: Not on file  Health Literacy: Not on file    Past Surgical History:  Procedure Laterality Date   ABDOMINOPLASTY     CESAREAN SECTION  12/04/2004    Family History  Problem Relation Age of Onset   Hypertension Mother    Diabetes Mother    Heart attack Mother    Hyperlipidemia Mother    Kidney disease Mother    Thyroid  disease Mother    Hypertension Father    Diabetes Father    Arthritis Father    Diabetes Sister     Allergies[1]  Medications Ordered Prior to  Encounter[2]  BP 138/78   Pulse 90   Temp 97.9 F (36.6 C) (Oral)   Ht 5' 4 (1.626 m)   Wt 175 lb (79.4 kg)   LMP  (Within Months)   SpO2 96%   BMI 30.04 kg/m  Objective:   Physical Exam HENT:     Right Ear: Tympanic membrane and ear canal normal.     Left Ear: Tympanic membrane and ear canal normal.  Eyes:     Pupils: Pupils are equal, round, and reactive to light.  Cardiovascular:     Rate and Rhythm: Normal rate and regular rhythm.  Pulmonary:     Effort: Pulmonary effort is normal.     Breath sounds: Normal breath sounds.  Abdominal:     General: Bowel sounds are normal.     Palpations: Abdomen is soft.     Tenderness: There is no abdominal tenderness.  Musculoskeletal:        General: Normal range of motion.     Cervical back: Neck supple.     Comments: Left upper extremity 4/5 in strength. Reduced compared to right.  Skin:    General: Skin is warm and dry.  Neurological:     Mental Status: She is alert and oriented to person, place, and time.     Cranial Nerves: No cranial nerve deficit.  Psychiatric:        Mood and Affect: Mood normal.     Physical Exam        Assessment & Plan:  Preventative health care Assessment & Plan: Printed Shingrix vaccine prescription per patient request. Pap smear UTD. Mammogram UTD Colonoscopy overdue, referral placed to GI  Discussed the importance of a healthy diet and regular exercise in order for weight loss, and to reduce the risk of further co-morbidity.  Exam stable. Labs pending.  Follow up in 1 year for repeat physical.    Hot flashes Assessment & Plan: Uncontrolled.   Failed venlafaxine  at low doses.  Start gabapentin  100-300 mg HS. She will update.    Chronic migraine without aura without status migrainosus, not intractable Assessment & Plan: Controlled.  No concerns today.    Cervical radiculopathy Assessment & Plan: Uncontrolled.  Never followed through with neurosurgery as referred.   New referral placed to neurosurgery.   Will also treat with gabapentin  100-300 mg HS.  Drowsiness precautions provided.   Orders: -     Ambulatory referral to Neurosurgery -     Gabapentin ; Take 1 to 3 capsules by mouth at bedtime for pain and hot flashes.  Dispense: 90 capsule; Refill: 0  Eczema, unspecified type Assessment & Plan: Controlled.  Following with dermatology. Continue clobetasol  0.05% cream PRN, triamcinolone  0.1% cream  PRN    Prediabetes Assessment & Plan: Repeat A1C pending.  Work on a healthy diet and regular exercise in order for weight loss, and to reduce the risk of further co-morbidity.   Orders: -     Comprehensive metabolic panel with GFR -     Lipid panel -     Hemoglobin A1c  Menorrhagia with irregular cycle Assessment & Plan: Resolved.  Iron  studies pending. Not currently on supplementation    Iron  deficiency anemia, unspecified iron  deficiency anemia type Assessment & Plan: No iron  supplement for quite sometime.  Repeat iron  studies pending.  Colonoscopy referral placed.   Orders: -     IBC + Ferritin  Need for shingles vaccine -     Zoster Vac Recomb Adjuvanted; Inject 0.5 mLs into the muscle once for 1 dose. Repeat with second dose 2-6 months after initial.  Dispense: 0.5 mL; Refill: 1  Screening for colon cancer -     Ambulatory referral to Gastroenterology  Weakness of left upper extremity Assessment & Plan: Likely secondary to cervical radiculopathy.  Referral placed to neurosurgery.  Offered to update MRI, she declines and will talk with neurosurgery  Orders: -     Ambulatory referral to Neurosurgery  Vitamin D  deficiency Assessment & Plan: Repeat vitamin D  level pending. Not currently on supplementation  Orders: -     VITAMIN D  25 Hydroxy (Vit-D Deficiency, Fractures)  Vitamin B12 deficiency Assessment & Plan: Not on supplementation Repeat B12 pending.  Orders: -     Vitamin B12  Family history of  thyroid  disease Assessment & Plan: Repeat TSH pending  Orders: -     TSH  History of cold sores Assessment & Plan: Incorrect administration.  Discussed to take 2 pills of Valtrex  twice daily x 1 day for outbreaks.  She will update     Assessment and Plan Assessment & Plan         Faith MARLA Gaskins, NP       [1]  Allergies Allergen Reactions   Propofol Other (See Comments)    Wouldn't wake up from anesthesia Pt states, couldn't be awaken from surgery.    Doxycycline  Swelling  [2]  Current Outpatient Medications on File Prior to Visit  Medication Sig Dispense Refill   clindamycin (CLEOCIN T) 1 % external solution Apply topically 2 (two) times daily.     clobetasol  cream (TEMOVATE ) 0.05 % Apply topically 2 (two) times daily as needed. 30 g 0   Cyanocobalamin  (B-12 PO) Take by mouth.     desonide (DESOWEN) 0.05 % cream Apply topically 2 (two) times daily.     diclofenac  (VOLTAREN ) 75 MG EC tablet Take 1 tablet (75 mg total) by mouth 2 (two) times daily as needed for moderate pain (pain score 4-6). 180 tablet 3   hydroquinone 4 % cream Apply topically.     Iron , Ferrous Sulfate , 325 (65 Fe) MG TABS Take 325 mg by mouth daily. For iron . 90 tablet 1   metroNIDAZOLE  (METROGEL ) 0.75 % vaginal gel Place 1 Applicatorful vaginally 2 (two) times daily. 70 g 0   tretinoin  (RETIN-A ) 0.025 % cream Apply topically at bedtime. 45 g 0   triamcinolone  cream (KENALOG ) 0.1 % Apply topically 2 (two) times daily as needed. 30 g 0   valACYclovir  (VALTREX ) 1000 MG tablet Take 2 tablets by mouth twice daily x 1 day as needed for outbreaks. 12 tablet 0   No current facility-administered medications on file prior to visit.   "

## 2024-03-25 NOTE — Assessment & Plan Note (Signed)
 Uncontrolled.  Never followed through with neurosurgery as referred.  New referral placed to neurosurgery.   Will also treat with gabapentin  100-300 mg HS.  Drowsiness precautions provided.

## 2024-03-25 NOTE — Assessment & Plan Note (Signed)
 Likely secondary to cervical radiculopathy.  Referral placed to neurosurgery.  Offered to update MRI, she declines and will talk with neurosurgery

## 2024-03-25 NOTE — Assessment & Plan Note (Signed)
 Repeat vitamin D level pending. Not currently on supplementation.

## 2024-03-25 NOTE — Assessment & Plan Note (Signed)
 Controlled.  No concerns today.

## 2024-03-25 NOTE — Assessment & Plan Note (Signed)
 Repeat A1C pending.  Work on a healthy diet and regular exercise in order for weight loss, and to reduce the risk of further co-morbidity.

## 2024-03-26 ENCOUNTER — Ambulatory Visit: Payer: Self-pay | Admitting: Primary Care

## 2024-03-26 DIAGNOSIS — E559 Vitamin D deficiency, unspecified: Secondary | ICD-10-CM

## 2024-03-26 DIAGNOSIS — D509 Iron deficiency anemia, unspecified: Secondary | ICD-10-CM

## 2024-03-26 DIAGNOSIS — E538 Deficiency of other specified B group vitamins: Secondary | ICD-10-CM

## 2024-03-26 DIAGNOSIS — Z113 Encounter for screening for infections with a predominantly sexual mode of transmission: Secondary | ICD-10-CM

## 2024-03-26 DIAGNOSIS — Z111 Encounter for screening for respiratory tuberculosis: Secondary | ICD-10-CM

## 2024-03-26 LAB — LIPID PANEL
Cholesterol: 180 mg/dL (ref 28–200)
HDL: 50.9 mg/dL
LDL Cholesterol: 107 mg/dL — ABNORMAL HIGH (ref 10–99)
NonHDL: 129.51
Total CHOL/HDL Ratio: 4
Triglycerides: 115 mg/dL (ref 10.0–149.0)
VLDL: 23 mg/dL (ref 0.0–40.0)

## 2024-03-26 LAB — IBC + FERRITIN
Ferritin: 6 ng/mL — ABNORMAL LOW (ref 10.0–291.0)
Iron: 33 ug/dL — ABNORMAL LOW (ref 42–145)
Saturation Ratios: 6.6 % — ABNORMAL LOW (ref 20.0–50.0)
TIBC: 502.6 ug/dL — ABNORMAL HIGH (ref 250.0–450.0)
Transferrin: 359 mg/dL (ref 212.0–360.0)

## 2024-03-26 LAB — COMPREHENSIVE METABOLIC PANEL WITH GFR
ALT: 17 U/L (ref 3–35)
AST: 17 U/L (ref 5–37)
Albumin: 4.1 g/dL (ref 3.5–5.2)
Alkaline Phosphatase: 87 U/L (ref 39–117)
BUN: 11 mg/dL (ref 6–23)
CO2: 29 meq/L (ref 19–32)
Calcium: 9 mg/dL (ref 8.4–10.5)
Chloride: 102 meq/L (ref 96–112)
Creatinine, Ser: 0.69 mg/dL (ref 0.40–1.20)
GFR: 99.34 mL/min
Glucose, Bld: 89 mg/dL (ref 70–99)
Potassium: 3.7 meq/L (ref 3.5–5.1)
Sodium: 137 meq/L (ref 135–145)
Total Bilirubin: 0.2 mg/dL (ref 0.2–1.2)
Total Protein: 7.5 g/dL (ref 6.0–8.3)

## 2024-03-26 LAB — VITAMIN D 25 HYDROXY (VIT D DEFICIENCY, FRACTURES): VITD: 9.57 ng/mL — ABNORMAL LOW (ref 30.00–100.00)

## 2024-03-26 LAB — VITAMIN B12: Vitamin B-12: 146 pg/mL — ABNORMAL LOW (ref 211–911)

## 2024-03-26 LAB — TSH: TSH: 1.95 u[IU]/mL (ref 0.35–5.50)

## 2024-03-26 LAB — HEMOGLOBIN A1C: Hgb A1c MFr Bld: 6.5 % (ref 4.6–6.5)

## 2024-03-26 MED ORDER — IRON (FERROUS SULFATE) 325 (65 FE) MG PO TABS: 325.0000 mg | ORAL_TABLET | Freq: Every day | ORAL | 1 refills | Status: AC

## 2024-03-26 MED ORDER — VITAMIN D (ERGOCALCIFEROL) 1.25 MG (50000 UNIT) PO CAPS: ORAL_CAPSULE | ORAL | 0 refills | Status: AC

## 2024-04-17 ENCOUNTER — Other Ambulatory Visit: Payer: Self-pay | Admitting: Primary Care

## 2024-04-17 DIAGNOSIS — L309 Dermatitis, unspecified: Secondary | ICD-10-CM

## 2024-04-22 MED ORDER — "SYRINGE/NEEDLE (DISP) 25G X 1"" 1 ML MISC": 0 refills | Status: AC

## 2024-04-22 MED ORDER — CYANOCOBALAMIN 1000 MCG/ML IJ SOLN: INTRAMUSCULAR | 0 refills | Status: AC

## 2024-04-23 ENCOUNTER — Ambulatory Visit

## 2024-04-23 ENCOUNTER — Other Ambulatory Visit

## 2024-04-23 DIAGNOSIS — Z113 Encounter for screening for infections with a predominantly sexual mode of transmission: Secondary | ICD-10-CM

## 2024-04-23 DIAGNOSIS — Z111 Encounter for screening for respiratory tuberculosis: Secondary | ICD-10-CM

## 2024-04-23 NOTE — Progress Notes (Signed)
 PPD Placement note Faith Roth, 54 y.o. female is here today for placement of PPD test Reason for PPD test: Employment Pt taken PPD test before: yes O: Alert and oriented in NAD. P:  PPD placed on 04/23/2024.  Patient advised to return for reading within 48-72 hours.

## 2024-04-24 LAB — HIV ANTIBODY (ROUTINE TESTING W REFLEX)
HIV 1&2 Ab, 4th Generation: NONREACTIVE
HIV FINAL INTERPRETATION: NEGATIVE

## 2024-04-24 LAB — HEPATITIS C ANTIBODY: Hepatitis C Ab: NONREACTIVE

## 2024-04-24 LAB — TRICHOMONAS VAGINALIS, PROBE AMP: Trichomonas vaginalis RNA: NOT DETECTED

## 2024-04-24 LAB — C. TRACHOMATIS/N. GONORRHOEAE RNA
C. trachomatis RNA, TMA: NOT DETECTED
N. gonorrhoeae RNA, TMA: NOT DETECTED

## 2024-04-24 LAB — SYPHILIS: RPR W/REFLEX TO RPR TITER AND TREPONEMAL ANTIBODIES, TRADITIONAL SCREENING AND DIAGNOSIS ALGORITHM: RPR Ser Ql: NONREACTIVE

## 2024-04-25 ENCOUNTER — Ambulatory Visit: Payer: Self-pay | Admitting: Primary Care
